# Patient Record
Sex: Female | Born: 1955 | Race: Black or African American | Hispanic: No | Marital: Single | Smoking: Never smoker
Health system: Southern US, Community
[De-identification: ages and names within clinical notes are randomized; demographics above are authoritative.]

## PROBLEM LIST (undated history)

## (undated) DIAGNOSIS — I499 Cardiac arrhythmia, unspecified: Secondary | ICD-10-CM

## (undated) DIAGNOSIS — I10 Essential (primary) hypertension: Secondary | ICD-10-CM

## (undated) DIAGNOSIS — M1990 Unspecified osteoarthritis, unspecified site: Secondary | ICD-10-CM

## (undated) DIAGNOSIS — I517 Cardiomegaly: Secondary | ICD-10-CM

## (undated) HISTORY — DX: Essential (primary) hypertension: I10

---

## 2005-01-21 ENCOUNTER — Ambulatory Visit: Payer: Self-pay | Admitting: Family Medicine

## 2005-01-26 ENCOUNTER — Ambulatory Visit: Payer: Self-pay | Admitting: Family Medicine

## 2007-09-20 ENCOUNTER — Emergency Department: Payer: Self-pay | Admitting: Emergency Medicine

## 2007-10-29 ENCOUNTER — Emergency Department: Payer: Self-pay | Admitting: Emergency Medicine

## 2007-12-01 HISTORY — PX: KNEE SURGERY: SHX244

## 2008-03-15 ENCOUNTER — Emergency Department: Payer: Self-pay | Admitting: Internal Medicine

## 2008-05-08 ENCOUNTER — Emergency Department: Payer: Self-pay | Admitting: Emergency Medicine

## 2008-09-12 ENCOUNTER — Emergency Department: Payer: Self-pay | Admitting: Emergency Medicine

## 2008-11-24 ENCOUNTER — Emergency Department: Payer: Self-pay | Admitting: Emergency Medicine

## 2008-12-14 ENCOUNTER — Ambulatory Visit: Payer: Self-pay | Admitting: Unknown Physician Specialty

## 2008-12-18 ENCOUNTER — Ambulatory Visit: Payer: Self-pay | Admitting: Unknown Physician Specialty

## 2010-02-10 ENCOUNTER — Ambulatory Visit: Payer: Self-pay

## 2011-02-10 ENCOUNTER — Emergency Department: Payer: Self-pay | Admitting: Emergency Medicine

## 2011-10-29 ENCOUNTER — Ambulatory Visit: Payer: Self-pay

## 2013-05-08 ENCOUNTER — Ambulatory Visit: Payer: Self-pay

## 2013-06-27 ENCOUNTER — Ambulatory Visit: Payer: Self-pay | Admitting: Pain Medicine

## 2014-02-05 ENCOUNTER — Emergency Department: Payer: Self-pay | Admitting: Emergency Medicine

## 2014-03-17 ENCOUNTER — Emergency Department: Payer: Self-pay | Admitting: Emergency Medicine

## 2014-08-24 ENCOUNTER — Ambulatory Visit: Payer: Self-pay | Admitting: Family Medicine

## 2015-01-15 ENCOUNTER — Encounter: Payer: Self-pay | Admitting: Physical Medicine & Rehabilitation

## 2015-01-22 ENCOUNTER — Ambulatory Visit (HOSPITAL_COMMUNITY)
Admission: RE | Admit: 2015-01-22 | Discharge: 2015-01-22 | Disposition: A | Discharge status: Home / Self Care | Payer: Medicaid Other | Source: Ambulatory Visit | Attending: Physical Medicine & Rehabilitation | Admitting: Physical Medicine & Rehabilitation

## 2015-01-22 ENCOUNTER — Ambulatory Visit (HOSPITAL_BASED_OUTPATIENT_CLINIC_OR_DEPARTMENT_OTHER): Payer: Medicaid Other | Admitting: Physical Medicine & Rehabilitation

## 2015-01-22 ENCOUNTER — Encounter: Payer: Medicaid Other | Attending: Physical Medicine & Rehabilitation

## 2015-01-22 ENCOUNTER — Other Ambulatory Visit: Payer: Self-pay | Admitting: Physical Medicine & Rehabilitation

## 2015-01-22 ENCOUNTER — Encounter: Payer: Self-pay | Admitting: Physical Medicine & Rehabilitation

## 2015-01-22 VITALS — BP 154/90 | HR 94 | Resp 14

## 2015-01-22 DIAGNOSIS — G894 Chronic pain syndrome: Secondary | ICD-10-CM

## 2015-01-22 DIAGNOSIS — M5136 Other intervertebral disc degeneration, lumbar region: Secondary | ICD-10-CM | POA: Insufficient documentation

## 2015-01-22 DIAGNOSIS — Z79899 Other long term (current) drug therapy: Secondary | ICD-10-CM | POA: Diagnosis not present

## 2015-01-22 DIAGNOSIS — Z5181 Encounter for therapeutic drug level monitoring: Secondary | ICD-10-CM | POA: Diagnosis present

## 2015-01-22 DIAGNOSIS — M5410 Radiculopathy, site unspecified: Secondary | ICD-10-CM

## 2015-01-22 DIAGNOSIS — M25561 Pain in right knee: Secondary | ICD-10-CM | POA: Diagnosis not present

## 2015-01-22 DIAGNOSIS — G8929 Other chronic pain: Secondary | ICD-10-CM | POA: Insufficient documentation

## 2015-01-22 DIAGNOSIS — M5416 Radiculopathy, lumbar region: Secondary | ICD-10-CM

## 2015-01-22 DIAGNOSIS — M25569 Pain in unspecified knee: Secondary | ICD-10-CM

## 2015-01-22 DIAGNOSIS — M545 Low back pain: Secondary | ICD-10-CM | POA: Diagnosis present

## 2015-01-22 MED ORDER — GABAPENTIN 300 MG PO CAPS: 300.0000 mg | ORAL_CAPSULE | Freq: Every day | ORAL | Status: DC
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
## 2015-01-22 NOTE — Patient Instructions (Addendum)
Recommending increase meloxicam to 2 tablets per day  Start a new medicine gabapentin to take before you go to bed  We will need to review urine drug screen as well as x-rays prior to prescribing a stronger pill such as oxycodone 5 milligrams

## 2015-01-23 LAB — PMP ALCOHOL METABOLITE (ETG): Ethyl Glucuronide (EtG): NEGATIVE ng/mL

## 2015-01-24 LAB — PRESCRIPTION MONITORING PROFILE (SOLSTAS)
Amphetamine/Meth: NEGATIVE ng/mL
Barbiturate Screen, Urine: NEGATIVE ng/mL
Benzodiazepine Screen, Urine: NEGATIVE ng/mL
Buprenorphine, Urine: NEGATIVE ng/mL
CANNABINOID SCRN UR: NEGATIVE ng/mL
COCAINE METABOLITES: NEGATIVE ng/mL
CREATININE, URINE: 148.41 mg/dL (ref >20.0)
Carisoprodol, Urine: NEGATIVE ng/mL
Fentanyl, Ur: NEGATIVE ng/mL
MDMA URINE: NEGATIVE ng/mL
MEPERIDINE UR: NEGATIVE ng/mL
METHADONE SCREEN, URINE: NEGATIVE ng/mL
Nitrites, Initial: NEGATIVE ug/mL
OXYCODONE SCRN UR: NEGATIVE ng/mL
Opiate Screen, Urine: NEGATIVE ng/mL
Propoxyphene: NEGATIVE ng/mL
Tapentadol, urine: NEGATIVE ng/mL
Tramadol Scrn, Ur: NEGATIVE ng/mL
Zolpidem, Urine: NEGATIVE ng/mL
pH, Initial: 6.8 pH (ref 4.5–8.9)

## 2015-02-01 NOTE — Progress Notes (Signed)
Urine drug screen for this encounter is consistent for no prescribed controlled medication

## 2015-02-12 ENCOUNTER — Encounter: Payer: Medicaid Other | Attending: Physical Medicine & Rehabilitation | Admitting: Registered Nurse

## 2015-02-12 ENCOUNTER — Encounter: Payer: Self-pay | Admitting: Registered Nurse

## 2015-02-12 VITALS — BP 142/76 | HR 93 | Resp 14

## 2015-02-12 DIAGNOSIS — Z5181 Encounter for therapeutic drug level monitoring: Secondary | ICD-10-CM | POA: Insufficient documentation

## 2015-02-12 DIAGNOSIS — M5410 Radiculopathy, site unspecified: Secondary | ICD-10-CM | POA: Diagnosis not present

## 2015-02-12 DIAGNOSIS — G894 Chronic pain syndrome: Secondary | ICD-10-CM | POA: Insufficient documentation

## 2015-02-12 DIAGNOSIS — M25561 Pain in right knee: Secondary | ICD-10-CM | POA: Diagnosis not present

## 2015-02-12 DIAGNOSIS — Z79899 Other long term (current) drug therapy: Secondary | ICD-10-CM | POA: Diagnosis not present

## 2015-02-12 DIAGNOSIS — G8929 Other chronic pain: Secondary | ICD-10-CM | POA: Insufficient documentation

## 2015-02-12 DIAGNOSIS — M5416 Radiculopathy, lumbar region: Secondary | ICD-10-CM

## 2015-02-12 MED ORDER — OXYCODONE HCL 5 MG PO TABS: 5.0000 mg | ORAL_TABLET | Freq: Three times a day (TID) | ORAL | Status: DC

## 2015-02-12 NOTE — Progress Notes (Signed)
Subjective:    Patient ID: Terri Jennings, female    DOB: 05-19-1956, 59 y.o.   MRN: [id number]  HPI: Ms. Terri Jennings is a 59 year old female who returns for follow up for chronic pain and medication refill. She says her pain is located in her lower back and radiates into her right lower extremity anteriorly and posteriorly. Also right knee pain.  She reiterates she is not interested in a knee replacement at this time.She rates her pain 10. Her current exercise regime is walking short distances. She had a UDS last vist her drug screen was consistent. She will be prescribed oxycodone per Dr. Wynn BankerKirsteins note.  Pain Inventory Average Pain 10 Pain Right Now 10 My pain is constant, stabbing, tingling and aching  In the last 24 hours, has pain interfered with the following? General activity 9 Relation with others 8 Enjoyment of life 9 What TIME of day is your pain at its worst? night Sleep (in general) Poor  Pain is worse with: walking, bending, sitting, inactivity and standing Pain improves with: medication Relief from Meds: 5  Mobility walk with assistance use a cane ability to climb steps?  yes do you drive?  no  Function disabled: date disabled . Do you have any goals in this area?  no  Neuro/Psych numbness tingling trouble walking spasms dizziness anxiety  Prior Studies Any changes since last visit?  no  Physicians involved in your care Any changes since last visit?  no   Family History  Problem Relation Age of Onset  . Stroke Father   . Cancer Maternal Grandmother    History   Social History  . Marital Status: Single    Spouse Name: N/A  . Number of Children: N/A  . Years of Education: N/A   Social History Main Topics  . Smoking status: Never Smoker   . Smokeless tobacco: Not on file  . Alcohol Use: 0.0 oz/week    0 Standard drinks or equivalent per week  . Drug Use: No  . Sexual Activity:    Partners: Male   Other Topics  Concern  . None   Social History Narrative   Past Surgical History  Procedure Laterality Date  . Knee surgery Right 2009   Past Medical History  Diagnosis Date  . Diabetes mellitus without complication   . Hypertension    BP 142/76 mmHg  Pulse 93  Resp 14  SpO2 96%  LMP  (Approximate)  Opioid Risk Score:   Fall Risk Score:    Review of Systems  Constitutional:       Night sweats  Cardiovascular: Positive for leg swelling.  Musculoskeletal: Positive for gait problem.  Neurological: Positive for dizziness and numbness.       Tingling spasms  Psychiatric/Behavioral: The patient is nervous/anxious.   All other systems reviewed and are negative.      Objective:   Physical Exam  Constitutional: She is oriented to person, place, and time. She appears well-developed and well-nourished.  HENT:  Head: Normocephalic and atraumatic.  Neck: Normal range of motion. Neck supple.  Cardiovascular: Normal rate and regular rhythm.   Pulmonary/Chest: Effort normal and breath sounds normal.  Musculoskeletal:  Normal Muscle Bulk and Muscle Testing Reveals: Upper Extremities: Full ROM and Muscle strength 5/5 Thoracic Paraspinal Tenderness: T-10- T-12 Lumbar Paraspinal Tenderness: L-3- L-5 Lower Extremities: Full ROM and Muscle Strength 5/5 Right Lower Extremity Flexion Produces Pain into Patella and Lumbar. Right Lower extremity Edema Patella Tender with Palpation/  Swelling Left Lower Extremity Flexion Produces Pain into Patella and Lumbar Arises from chair with ease Antalgic Gait     Neurological: She is alert and oriented to person, place, and time.  Skin: Skin is warm and dry.  Psychiatric: She has a normal mood and affect.  Nursing note and vitals reviewed.         Assessment & Plan:  1. Chronic Low Back Pain/ Radicular:RX: Oxycodone  3 times a day #90. Continue Gabapentin 2. Right Knee Pain: Continue Meloxicam  20 minutes of face to face patient care time was  spent during this visit. All questions were encouraged and answered.  F/U in 1 month

## 2015-03-12 ENCOUNTER — Encounter: Payer: Medicaid Other | Attending: Physical Medicine & Rehabilitation | Admitting: Registered Nurse

## 2015-03-12 DIAGNOSIS — M5410 Radiculopathy, site unspecified: Secondary | ICD-10-CM | POA: Insufficient documentation

## 2015-03-12 DIAGNOSIS — Z5181 Encounter for therapeutic drug level monitoring: Secondary | ICD-10-CM | POA: Insufficient documentation

## 2015-03-12 DIAGNOSIS — G8929 Other chronic pain: Secondary | ICD-10-CM | POA: Insufficient documentation

## 2015-03-12 DIAGNOSIS — G894 Chronic pain syndrome: Secondary | ICD-10-CM | POA: Insufficient documentation

## 2015-03-12 DIAGNOSIS — M25561 Pain in right knee: Secondary | ICD-10-CM | POA: Insufficient documentation

## 2015-03-12 DIAGNOSIS — Z79899 Other long term (current) drug therapy: Secondary | ICD-10-CM | POA: Insufficient documentation

## 2016-09-17 ENCOUNTER — Encounter: Payer: Medicaid Other | Attending: Physical Medicine & Rehabilitation

## 2016-09-17 ENCOUNTER — Ambulatory Visit (HOSPITAL_BASED_OUTPATIENT_CLINIC_OR_DEPARTMENT_OTHER): Payer: Medicaid Other | Admitting: Physical Medicine & Rehabilitation

## 2016-09-17 ENCOUNTER — Telehealth: Payer: Self-pay | Admitting: *Deleted

## 2016-09-17 ENCOUNTER — Encounter: Payer: Self-pay | Admitting: Physical Medicine & Rehabilitation

## 2016-09-17 VITALS — BP 122/84 | HR 89

## 2016-09-17 DIAGNOSIS — Z823 Family history of stroke: Secondary | ICD-10-CM | POA: Diagnosis not present

## 2016-09-17 DIAGNOSIS — M1711 Unilateral primary osteoarthritis, right knee: Secondary | ICD-10-CM

## 2016-09-17 DIAGNOSIS — M545 Low back pain: Secondary | ICD-10-CM | POA: Diagnosis not present

## 2016-09-17 DIAGNOSIS — G894 Chronic pain syndrome: Secondary | ICD-10-CM | POA: Insufficient documentation

## 2016-09-17 DIAGNOSIS — Z79891 Long term (current) use of opiate analgesic: Secondary | ICD-10-CM | POA: Diagnosis not present

## 2016-09-17 DIAGNOSIS — G8929 Other chronic pain: Secondary | ICD-10-CM | POA: Diagnosis not present

## 2016-09-17 DIAGNOSIS — E119 Type 2 diabetes mellitus without complications: Secondary | ICD-10-CM | POA: Diagnosis not present

## 2016-09-17 DIAGNOSIS — I10 Essential (primary) hypertension: Secondary | ICD-10-CM | POA: Insufficient documentation

## 2016-09-17 DIAGNOSIS — Z809 Family history of malignant neoplasm, unspecified: Secondary | ICD-10-CM | POA: Insufficient documentation

## 2016-09-17 NOTE — Telephone Encounter (Signed)
Called to let us know that she read the information on the butrans patch and it says not to take if you have mental health problems and she is diagnosed as a schizophrenic so she wanted us to know hat.  I told her I would let Dr Wynn BankerKirsteins know she called.

## 2016-09-17 NOTE — Patient Instructions (Signed)
If urine screen looks okay. Would call in a Butrans-patch. This is a past that you wear You do not have an allergy to this medication

## 2016-09-17 NOTE — Telephone Encounter (Signed)
Patient is not on any psychiatric medications that would be contraindicated with buprenorphine.   There are similar warnings with oxycodone and in fact oxycodone has more potential side effects in patients who have psychiatric illness.
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
## 2016-09-18 NOTE — Telephone Encounter (Signed)
I explained that too her that that would probably be the case.

## 2016-09-22 ENCOUNTER — Telehealth: Payer: Self-pay | Admitting: *Deleted

## 2016-09-22 NOTE — Telephone Encounter (Signed)
Pt called asking about her UDS result. I called her back and told her the results were not in yet and to please call back again in a couple of days

## 2016-09-23 ENCOUNTER — Telehealth: Payer: Self-pay | Admitting: *Deleted

## 2016-09-23 NOTE — Telephone Encounter (Signed)
Patient called again, asking about scheduling appointment for injections and if her urine screen has come back. (message previously left on pt's voice message explaining that UDS results had not come back yet)

## 2016-09-23 NOTE — Telephone Encounter (Signed)
I spoke again with Ms Terri Jennings and she would like to move her injection up as soon as possible (shceduled for 12/18/16). UDS is not back and we discussed again her concerns about wearing the patch planned for pain related more so to her hot flashes and night sweats.  She is concerned about the patch adhesive not working with diaphoresis.

## 2016-09-24 LAB — TOXASSURE SELECT,+ANTIDEPR,UR

## 2016-09-24 NOTE — Progress Notes (Signed)
Urine drug screen for this encounter is consistent for prescribed medication 

## 2016-09-24 NOTE — Telephone Encounter (Signed)
Butrans patch apply Qwk, #4, 0 RF

## 2016-09-24 NOTE — Telephone Encounter (Signed)
UDS was ok.  You mentioned the butrans in your office note but did not specify dose. Please do so.

## 2016-09-24 NOTE — Telephone Encounter (Signed)
The buprenorphine is not available in a pill form. Please try the patch, may try antiperspirant under the patch if needed

## 2016-09-25 ENCOUNTER — Telehealth: Payer: Self-pay

## 2016-09-25 DIAGNOSIS — G8929 Other chronic pain: Secondary | ICD-10-CM

## 2016-09-25 DIAGNOSIS — M5410 Radiculopathy, site unspecified: Secondary | ICD-10-CM

## 2016-09-25 DIAGNOSIS — M25561 Pain in right knee: Secondary | ICD-10-CM

## 2016-09-25 MED ORDER — BUPRENORPHINE 5 MCG/HR TD PTWK: 5.0000 ug | MEDICATED_PATCH | TRANSDERMAL | 0 refills | Status: DC

## 2016-09-25 NOTE — Telephone Encounter (Signed)
Patient has called four times sating that her Butrans patches are over 200 dollars, Patient stated she can not afford to pay that amount for patches because she is paying out of pocket. Would like pills if she could because they are a lot cheaper to advise. Please Advise

## 2016-09-25 NOTE — Telephone Encounter (Signed)
Patient states that she had Medicaid. Please confirm whether this is the case. Alternative medication would be Nucynta 50 mg 3 times per day, #90, no refill

## 2016-09-25 NOTE — Telephone Encounter (Signed)
Patient has been notified of Butrans patch 5 mcg was told to apply Qwk, #4, 0 Rf. Per Dr Wynn BankerKirsteins.  Also notified patient of UDS being consistent. [facility] [place]

## 2016-09-28 ENCOUNTER — Other Ambulatory Visit: Payer: Self-pay

## 2016-09-28 NOTE — Telephone Encounter (Signed)
Rx for Hydrocodone 5 mg 3 times a day #90, no refill

## 2016-09-28 NOTE — Telephone Encounter (Signed)
She is a self pay

## 2016-09-29 MED ORDER — HYDROCODONE-ACETAMINOPHEN 5-325 MG PO TABS: 1.0000 | ORAL_TABLET | Freq: Three times a day (TID) | ORAL | 0 refills | Status: DC | PRN

## 2016-09-29 NOTE — Addendum Note (Signed)
Addended by: Doreene ElandSHUMAKER, Beauregard Jarrells W on: 09/29/2016 01:52 PM   Modules accepted: Orders

## 2016-09-29 NOTE — Telephone Encounter (Signed)
Terri Jennings called again for pain med.  I have told her we are writing an RX for hydrocodone and she will need to come to pick up at office..  A CSA will be signed.

## 2016-10-05 ENCOUNTER — Telehealth: Payer: Self-pay | Admitting: Physical Medicine & Rehabilitation

## 2016-10-05 NOTE — Telephone Encounter (Signed)
Terri Jennings is saying these medication is making her sick (hydrocodone)  She has listed oxycontin and morphine makes her sick. If she wants something else she is going to have to come back in to be seen.  She says she threw the hydrocodone away and I told he she cannot do that.  She says they are just in the trash so she is going to go through it and get them out and bring them to appt.  She is making an appointment.

## 2016-10-05 NOTE — Telephone Encounter (Signed)
Patient has questions about medication she picked up in office.  Please call her at [phone].

## 2016-10-06 ENCOUNTER — Encounter: Payer: Self-pay | Admitting: Physical Medicine & Rehabilitation

## 2016-10-06 ENCOUNTER — Ambulatory Visit (HOSPITAL_BASED_OUTPATIENT_CLINIC_OR_DEPARTMENT_OTHER): Payer: Medicaid Other | Admitting: Physical Medicine & Rehabilitation

## 2016-10-06 ENCOUNTER — Encounter: Payer: Medicaid Other | Attending: Physical Medicine & Rehabilitation

## 2016-10-06 VITALS — BP 151/96 | HR 82 | Resp 16

## 2016-10-06 DIAGNOSIS — E119 Type 2 diabetes mellitus without complications: Secondary | ICD-10-CM | POA: Insufficient documentation

## 2016-10-06 DIAGNOSIS — G894 Chronic pain syndrome: Secondary | ICD-10-CM | POA: Insufficient documentation

## 2016-10-06 DIAGNOSIS — G8929 Other chronic pain: Secondary | ICD-10-CM | POA: Diagnosis not present

## 2016-10-06 DIAGNOSIS — Z809 Family history of malignant neoplasm, unspecified: Secondary | ICD-10-CM | POA: Insufficient documentation

## 2016-10-06 DIAGNOSIS — Z79891 Long term (current) use of opiate analgesic: Secondary | ICD-10-CM | POA: Insufficient documentation

## 2016-10-06 DIAGNOSIS — M25561 Pain in right knee: Secondary | ICD-10-CM

## 2016-10-06 DIAGNOSIS — Z823 Family history of stroke: Secondary | ICD-10-CM | POA: Insufficient documentation

## 2016-10-06 DIAGNOSIS — I10 Essential (primary) hypertension: Secondary | ICD-10-CM | POA: Diagnosis not present

## 2016-10-06 DIAGNOSIS — M545 Low back pain, unspecified: Secondary | ICD-10-CM

## 2016-10-06 DIAGNOSIS — M1711 Unilateral primary osteoarthritis, right knee: Secondary | ICD-10-CM | POA: Insufficient documentation

## 2016-10-06 MED ORDER — OXYCODONE HCL 5 MG PO TABS: 5.0000 mg | ORAL_TABLET | Freq: Three times a day (TID) | ORAL | 0 refills | Status: DC | PRN

## 2016-10-06 NOTE — Progress Notes (Signed)
Subjective:    Patient ID: Terri Jennings, female    DOB: [birth year], 58 y.o.   MRN: [id number]  HPI 60 year old female with knee osteoarthritis graded as moderate as well as chronic low back pain with minimal x-ray findings. She returns today stating that she could not tolerate the hydrocodone 5 mg 3 times a day prescribed. Pt states that she was able to tolerate oxycodone 5mg  TID in the past Patient indicates that her right knee feels swollen. No falls or other new trauma. She states she's had cortisone injections in the knee, which did not last very long. The Synvisc lasted a bit longer, but this was only 6-8 weeks.  She has seen orthopedics in the past and discussed knee replacement with her. Germs of her low back. She keys have pain. She states one doctor told her, her back was 2 inches shorter on one side. Prior lumbar x-rays were reviewed from 2016 which showed no significant disc space narrowing in the lower lumbar. There was some narrowing at L1-L2, no facet arthropathy, good alignment. No evidence of scoliosis Pain Inventory Average Pain 9 Pain Right Now 9 My pain is sharp, tingling and aching  In the last 24 hours, has pain interfered with the following? General activity 0 Relation with others 0 Enjoyment of life 0 What TIME of day is your pain at its worst? daytime evening and night Sleep (in general) Fair  Pain is worse with: walking, bending, sitting and standing Pain improves with: medication Relief from Meds: not answered  Mobility how many minutes can you walk? 5 ability to climb steps?  no do you drive?  yes  Function disabled: date disabled .  Neuro/Psych tremor anxiety  Prior Studies Any changes since last visit?  no  Physicians involved in your care Any changes since last visit?  no   Family History  Problem Relation Age of Onset  . Stroke Father   . Cancer Maternal Grandmother    Social History   Social History  . Marital  status: Single    Spouse name: N/A  . Number of children: N/A  . Years of education: N/A   Social History Main Topics  . Smoking status: Never Smoker  . Smokeless tobacco: Never Used  . Alcohol use 0.0 oz/week  . Drug use: No  . Sexual activity: Not Currently    Partners: Male   Other Topics Concern  . None   Social History Narrative  . None   Past Surgical History:  Procedure Laterality Date  . KNEE SURGERY Right 2009   Past Medical History:  Diagnosis Date  . Diabetes mellitus without complication (HCC)   . Hypertension    BP (!) 151/96   Pulse 82   Resp 16   LMP  (Approximate) Comment: 06/2014  SpO2 97%   Opioid Risk Score:   Fall Risk Score:  `1  Depression screen PHQ 2/9  [facility] 09/17/2016 02/12/2015  Decreased Interest 2 0  Down, Depressed, Hopeless 1 1  PHQ - 2 Score 3 1  Altered sleeping 2 2  Tired, decreased energy 2 3  Change in appetite 2 3  Feeling bad or failure about yourself  1 0  Trouble concentrating 1 0  Moving slowly or fidgety/restless 1 0  Suicidal thoughts 0 0  PHQ-9 Score 12 9  Difficult doing work/chores Somewhat difficult -    Review of Systems  Constitutional: Positive for unexpected weight change.  All other systems reviewed and  are negative.      Objective:   Physical Exam  Constitutional: She is oriented to person, place, and time. She appears well-developed and well-nourished.  HENT:  Head: Normocephalic and atraumatic.  Eyes: Conjunctivae and EOM are normal. Pupils are equal, round, and reactive to light.  Neck: Normal range of motion.  Neurological: She is alert and oriented to person, place, and time.  Psychiatric: She has a normal mood and affect.  Nursing note and vitals reviewed.  right knee has no evidence of erythema. No tenderness to palpation. Minimal effusion, patella not ballotable, good knee range of motion. Left knee without tenderness, erythema, swelling, effusion. Straight leg  raising. Motor strength is normal in lower extremities.      Assessment & Plan:  1. Chronic knee pain, right greater than left, evidence of at least moderate osteoarthritis, patellofemoral, as well as femuro tibial Given her prior experience with intra-articular injections would not recommend repeat. Have offered referral to orthopedics. However, she does not wish to consider surgery at the current time. She may benefit from Voltaren gel, although her insurance may not pay. Will trial oxycodone 5 mg 3 times a day follow-up with respect to her one month. She turned in her hydrocodone. Had tried for 2 days with severe nausea and dizziness 2. Chronic low back pain. Last x-rays unremarkable. No radicular symptoms. We'll repeat x-rays. Given her prolonged pain last films done approximately 2 years ago

## 2016-10-06 NOTE — Patient Instructions (Signed)
Do not feel any knee injections will give long-term relief, may benefit from orthopedic referral  X-rays of the low back and mid back.

## 2016-10-08 NOTE — Addendum Note (Signed)
Addended by: Doreene ElandSHUMAKER, SYBIL W on: 10/08/2016 02:53 PM   Modules accepted: Orders

## 2016-11-03 ENCOUNTER — Ambulatory Visit
Admission: RE | Admit: 2016-11-03 | Discharge: 2016-11-03 | Disposition: A | Discharge status: Home / Self Care | Payer: Medicaid Other | Source: Ambulatory Visit | Attending: Physical Medicine & Rehabilitation | Admitting: Physical Medicine & Rehabilitation

## 2016-11-03 ENCOUNTER — Telehealth: Payer: Self-pay | Admitting: *Deleted

## 2016-11-03 DIAGNOSIS — M4184 Other forms of scoliosis, thoracic region: Secondary | ICD-10-CM | POA: Insufficient documentation

## 2016-11-03 DIAGNOSIS — G8929 Other chronic pain: Secondary | ICD-10-CM | POA: Insufficient documentation

## 2016-11-03 DIAGNOSIS — M545 Low back pain, unspecified: Secondary | ICD-10-CM

## 2016-11-03 DIAGNOSIS — M8588 Other specified disorders of bone density and structure, other site: Secondary | ICD-10-CM | POA: Diagnosis not present

## 2016-11-03 DIAGNOSIS — M47896 Other spondylosis, lumbar region: Secondary | ICD-10-CM | POA: Insufficient documentation

## 2016-11-03 NOTE — Telephone Encounter (Signed)
Radiology called and says thoracolumbar spine order is not an acceptable order.  New orders placed

## 2016-11-04 ENCOUNTER — Telehealth: Payer: Self-pay

## 2016-11-04 NOTE — Telephone Encounter (Addendum)
Patient called for a med refill on oxycodone meds,

## 2016-11-04 NOTE — Telephone Encounter (Signed)
Attempted to reach Ms Ilda BassetCardona. No answer and voicemail could not accept messages.  Letter has been written for discharge and a copy of surrounding pain clinics will be included

## 2016-11-04 NOTE — Telephone Encounter (Signed)
discharge due to mult providers

## 2016-11-04 NOTE — Telephone Encounter (Signed)
Terri Jennings called for a refill of oxycodone that she filled on 10/06/16. I pulled a NCCSR on her and she filled on 10/19/16 and #90 oxycodone 10/325 from her [place] on 10/19/16.  This was denied by Ms Ilda Bassetardona but I verified it with the pharmacy which [facility]

## 2016-12-18 ENCOUNTER — Ambulatory Visit: Payer: Self-pay | Admitting: Physical Medicine & Rehabilitation

## 2017-03-11 ENCOUNTER — Encounter: Payer: Self-pay | Admitting: *Deleted

## 2017-03-18 ENCOUNTER — Encounter
Admission: RE | Disposition: A | Discharge status: Home / Self Care | Payer: Self-pay | Source: Ambulatory Visit | Attending: Ophthalmology

## 2017-03-18 ENCOUNTER — Ambulatory Visit: Payer: Medicaid Other | Admitting: Anesthesiology

## 2017-03-18 ENCOUNTER — Ambulatory Visit
Admission: RE | Admit: 2017-03-18 | Discharge: 2017-03-18 | Disposition: A | Discharge status: Home / Self Care | Payer: Medicaid Other | Source: Ambulatory Visit | Attending: Ophthalmology | Admitting: Ophthalmology

## 2017-03-18 ENCOUNTER — Encounter: Payer: Self-pay | Admitting: *Deleted

## 2017-03-18 DIAGNOSIS — I509 Heart failure, unspecified: Secondary | ICD-10-CM | POA: Insufficient documentation

## 2017-03-18 DIAGNOSIS — I110 Hypertensive heart disease with heart failure: Secondary | ICD-10-CM | POA: Diagnosis not present

## 2017-03-18 DIAGNOSIS — H2511 Age-related nuclear cataract, right eye: Secondary | ICD-10-CM | POA: Insufficient documentation

## 2017-03-18 DIAGNOSIS — M1990 Unspecified osteoarthritis, unspecified site: Secondary | ICD-10-CM | POA: Diagnosis not present

## 2017-03-18 DIAGNOSIS — K219 Gastro-esophageal reflux disease without esophagitis: Secondary | ICD-10-CM | POA: Insufficient documentation

## 2017-03-18 HISTORY — DX: Cardiomegaly: I51.7

## 2017-03-18 HISTORY — DX: Unspecified osteoarthritis, unspecified site: M19.90

## 2017-03-18 HISTORY — DX: Cardiac arrhythmia, unspecified: I49.9

## 2017-03-18 HISTORY — PX: CATARACT EXTRACTION W/PHACO: SHX586

## 2017-03-18 SURGERY — PHACOEMULSIFICATION, CATARACT, WITH IOL INSERTION
Anesthesia: Monitor Anesthesia Care | Site: Eye | Laterality: Right | Wound class: Clean

## 2017-03-18 MED ORDER — TRYPAN BLUE 0.06 % OP SOLN
OPHTHALMIC | Status: AC
  Filled 2017-03-18: qty 0.5

## 2017-03-18 MED ORDER — MIDAZOLAM HCL 2 MG/2ML IJ SOLN
INTRAMUSCULAR | Status: AC
  Filled 2017-03-18: qty 2

## 2017-03-18 MED ORDER — ARMC OPHTHALMIC DILATING DROPS
OPHTHALMIC | Status: AC
  Administered 2017-03-18: 1 via OPHTHALMIC
  Filled 2017-03-18: qty 0.4

## 2017-03-18 MED ORDER — MOXIFLOXACIN HCL 0.5 % OP SOLN: 1.0000 [drp] | OPHTHALMIC | Status: DC | PRN

## 2017-03-18 MED ORDER — TRYPAN BLUE 0.06 % OP SOLN
OPHTHALMIC | Status: DC | PRN
  Administered 2017-03-18: 0.5 mL via INTRAOCULAR

## 2017-03-18 MED ORDER — MOXIFLOXACIN HCL 0.5 % OP SOLN
OPHTHALMIC | Status: DC | PRN
  Administered 2017-03-18: 0.2 mL via OPHTHALMIC

## 2017-03-18 MED ORDER — MIDAZOLAM HCL 2 MG/2ML IJ SOLN
INTRAMUSCULAR | Status: DC | PRN
  Administered 2017-03-18: 1 mg via INTRAVENOUS
  Administered 2017-03-18: .5 mg via INTRAVENOUS

## 2017-03-18 MED ORDER — SODIUM HYALURONATE 23 MG/ML IO SOLN
INTRAOCULAR | Status: DC | PRN
  Administered 2017-03-18: 0.6 mL via INTRAOCULAR

## 2017-03-18 MED ORDER — ARMC OPHTHALMIC DILATING DROPS
1.0000 "application " | OPHTHALMIC | Status: AC
  Administered 2017-03-18 (×3): 1 via OPHTHALMIC

## 2017-03-18 MED ORDER — MOXIFLOXACIN HCL 0.5 % OP SOLN
OPHTHALMIC | Status: AC
  Filled 2017-03-18: qty 3

## 2017-03-18 MED ORDER — FENTANYL CITRATE (PF) 100 MCG/2ML IJ SOLN
INTRAMUSCULAR | Status: DC | PRN
  Administered 2017-03-18: 25 ug via INTRAVENOUS
  Administered 2017-03-18: 50 ug via INTRAVENOUS

## 2017-03-18 MED ORDER — LIDOCAINE HCL (PF) 2 % IJ SOLN
INTRAMUSCULAR | Status: AC
  Filled 2017-03-18: qty 2

## 2017-03-18 MED ORDER — EPINEPHRINE PF 1 MG/ML IJ SOLN
INTRAMUSCULAR | Status: DC | PRN
  Administered 2017-03-18: 08:00:00 via OPHTHALMIC

## 2017-03-18 MED ORDER — EPINEPHRINE PF 1 MG/ML IJ SOLN
INTRAMUSCULAR | Status: AC
  Filled 2017-03-18: qty 2

## 2017-03-18 MED ORDER — LIDOCAINE HCL (PF) 4 % IJ SOLN
INTRAOCULAR | Status: DC | PRN
  Administered 2017-03-18: 4 mL via OPHTHALMIC

## 2017-03-18 MED ORDER — FENTANYL CITRATE (PF) 100 MCG/2ML IJ SOLN
INTRAMUSCULAR | Status: AC
  Filled 2017-03-18: qty 2

## 2017-03-18 MED ORDER — SODIUM HYALURONATE 23 MG/ML IO SOLN
INTRAOCULAR | Status: AC
  Filled 2017-03-18: qty 0.6

## 2017-03-18 MED ORDER — POVIDONE-IODINE 5 % OP SOLN
OPHTHALMIC | Status: AC
  Filled 2017-03-18: qty 30

## 2017-03-18 MED ORDER — SODIUM HYALURONATE 10 MG/ML IO SOLN
INTRAOCULAR | Status: DC | PRN
  Administered 2017-03-18: 0.85 mL via INTRAOCULAR

## 2017-03-18 MED ORDER — SODIUM CHLORIDE 0.9 % IV SOLN
INTRAVENOUS | Status: DC
Start: 2017-03-18 — End: 2017-03-18
  Administered 2017-03-18 (×2): via INTRAVENOUS

## 2017-03-18 MED ORDER — CARBACHOL 0.01 % IO SOLN
INTRAOCULAR | Status: DC | PRN
  Administered 2017-03-18: 0.5 mL via INTRAOCULAR

## 2017-03-18 MED ORDER — SODIUM HYALURONATE 10 MG/ML IO SOLN
INTRAOCULAR | Status: AC
  Filled 2017-03-18: qty 0.85

## 2017-03-18 SURGICAL SUPPLY — 22 items
CANNULA ANT/CHMB 27GA (MISCELLANEOUS) ×9 IMPLANT
CUP MEDICINE 2OZ PLAST GRAD ST (MISCELLANEOUS) ×3 IMPLANT
DISSECTOR HYDRO NUCLEUS 50X22 (MISCELLANEOUS) ×3 IMPLANT
FILTER MILLEX .045 (MISCELLANEOUS) ×1 IMPLANT
FLTR MILLEX .045 (MISCELLANEOUS) ×3
GLOVE BIO SURGEON STRL SZ8 (GLOVE) ×3 IMPLANT
GLOVE BIOGEL M 6.5 STRL (GLOVE) ×3 IMPLANT
GLOVE SURG LX 7.5 STRW (GLOVE) ×2
GLOVE SURG LX STRL 7.5 STRW (GLOVE) ×1 IMPLANT
GOWN STRL REUS W/ TWL LRG LVL3 (GOWN DISPOSABLE) ×2 IMPLANT
GOWN STRL REUS W/TWL LRG LVL3 (GOWN DISPOSABLE) ×4
LENS IOL TECNIS ITEC 22.5 (Intraocular Lens) ×3 IMPLANT
PACK CATARACT (MISCELLANEOUS) ×3 IMPLANT
PACK CATARACT KING (MISCELLANEOUS) ×3 IMPLANT
PACK EYE AFTER SURG (MISCELLANEOUS) ×3 IMPLANT
SOL BSS BAG (MISCELLANEOUS) ×3
SOLUTION BSS BAG (MISCELLANEOUS) ×1 IMPLANT
SYR 3ML LL SCALE MARK (SYRINGE) ×9 IMPLANT
SYR 5ML LL (SYRINGE) ×3 IMPLANT
SYR TB 1ML 27GX1/2 LL (SYRINGE) ×3 IMPLANT
WATER STERILE IRR 250ML POUR (IV SOLUTION) ×3 IMPLANT
WIPE NON LINTING 3.25X3.25 (MISCELLANEOUS) ×3 IMPLANT

## 2017-03-18 NOTE — OR Nursing (Signed)
IV removed from right hand. Site clear. No edema or redness.

## 2017-03-18 NOTE — Anesthesia Procedure Notes (Signed)
Procedure Name: MAC Date/Time: 03/18/2017 8:25 AM Performed by: Allean Found Pre-anesthesia Checklist: Patient identified, Emergency Drugs available, Suction available, Patient being monitored and Timeout performed Patient Re-evaluated:Patient Re-evaluated prior to inductionOxygen Delivery Method: Nasal cannula Placement Confirmation: positive ETCO2

## 2017-03-18 NOTE — Transfer of Care (Signed)
Immediate Anesthesia Transfer of Care Note  Patient: Terri Jennings  Procedure(s) Performed: Procedure(s) with comments: CATARACT EXTRACTION PHACO AND INTRAOCULAR LENS PLACEMENT (IOC) (Right) - [place] 1:00.5 AP% 12.5 CDE 7.78 FLUID PACK LOT # [id number]  Patient Location: PACU  Anesthesia Type:MAC  Level of Consciousness: awake, alert  and oriented  Airway & Oxygen Therapy: Patient Spontanous Breathing  Post-op Assessment: Report given to RN and Post -op Vital signs reviewed and stable  Post vital signs: Reviewed and stable  Last Vitals:  Vitals:   03/18/17 0645  BP: 116/71  Pulse: 86  Resp: 20  Temp: 36.7 C    Last Pain:  Vitals:   03/18/17 0645  TempSrc: Oral  PainSc: 5          Complications: No apparent anesthesia complications

## 2017-03-18 NOTE — Anesthesia Post-op Follow-up Note (Cosign Needed)
Anesthesia QCDR form completed.        

## 2017-03-18 NOTE — Anesthesia Preprocedure Evaluation (Signed)
Anesthesia Evaluation  Patient identified by MRN, date of birth, ID band Patient awake    Reviewed: Allergy & Precautions, H&P , NPO status , Patient's Chart, lab work & pertinent test results  History of Anesthesia Complications Negative for: history of anesthetic complications  Airway Mallampati: III  TM Distance: >3 FB Neck ROM: full    Dental  (+) Poor Dentition, Missing, Upper Dentures, Partial Lower   Pulmonary neg pulmonary ROS, neg shortness of breath,    Pulmonary exam normal breath sounds clear to auscultation       Cardiovascular Exercise Tolerance: Good hypertension, (-) angina+CHF  (-) Past MI and (-) DOE Normal cardiovascular exam+ dysrhythmias  Rhythm:regular Rate:Normal     Neuro/Psych  Neuromuscular disease negative psych ROS   GI/Hepatic negative GI ROS, Neg liver ROS, neg GERD  ,  Endo/Other  negative endocrine ROS  Renal/GU      Musculoskeletal  (+) Arthritis ,   Abdominal   Peds  Hematology negative hematology ROS (+)   Anesthesia Other Findings Past Medical History: No date: Arthritis No date: Dysrhythmia No date: Enlarged heart No date: Hypertension  Past Surgical History: 2009: KNEE SURGERY Right  BMI    Body Mass Index:  35.87 kg/m      Reproductive/Obstetrics negative OB ROS                             Anesthesia Physical Anesthesia Plan  ASA: III  Anesthesia Plan: MAC   Post-op Pain Management:    Induction:   Airway Management Planned:   Additional Equipment:   Intra-op Plan:   Post-operative Plan:   Informed Consent: I have reviewed the patients History and Physical, chart, labs and discussed the procedure including the risks, benefits and alternatives for the proposed anesthesia with the patient or authorized representative who has indicated his/her understanding and acceptance.     Plan Discussed with: Anesthesiologist, CRNA and  Surgeon  Anesthesia Plan Comments:         Anesthesia Quick Evaluation

## 2017-03-18 NOTE — Discharge Instructions (Signed)
Eye Surgery Discharge Instructions  Expect mild scratchy sensation or mild soreness. DO NOT RUB YOUR EYE!  The day of surgery:  Minimal physical activity, but bed rest is not required  No reading, computer work, or close hand work  No bending, lifting, or straining.  May watch TV  For 24 hours:  No driving, legal decisions, or alcoholic beverages  Safety precautions  Eat anything you prefer: It is better to start with liquids, then soup then solid foods.  _____ Eye patch should be worn until postoperative exam tomorrow.  ____ Solar shield eyeglasses should be worn for comfort in the sunlight/patch while sleeping  Resume all regular medications including aspirin or Coumadin if these were discontinued prior to surgery. You may shower, bathe, shave, or wash your hair. Tylenol may be taken for mild discomfort.  Call your doctor if you experience significant pain, nausea, or vomiting, fever > 101 or other signs of infection. [phone] or [phone] Specific instructions:  Follow-up Information    Willey Blade, MD Follow up.   Specialty:  Ophthalmology Why:  Terri Jennings at 9:45am Contact information: [address] [place] [address] [phone]

## 2017-03-18 NOTE — H&P (Signed)
The History and Physical notes are on paper, have been signed, and are to be scanned.   I have examined the patient and there are no changes to the H&P.   Willey Blade 03/18/2017 8:13 AM

## 2017-03-18 NOTE — Op Note (Signed)
OPERATIVE NOTE  Terri Jennings [id number] 03/18/2017   PREOPERATIVE DIAGNOSIS:  Nuclear sclerotic cataract right eye.  H25.11   POSTOPERATIVE DIAGNOSIS:    Nuclear sclerotic cataract right eye.     PROCEDURE:  Phacoemusification with posterior chamber intraocular lens placement of the right eye   LENS:   Implant Name Type Inv. Item Serial No. Manufacturer Lot No. LRB No. Used  LENS IOL DIOP 22.5 - [id number] Intraocular Lens LENS IOL DIOP 22.5 [id number] AMO   Right 1       PCB00 +22.5   ULTRASOUND TIME: 1 minutes 00 seconds.  CDE 7.78   SURGEON:  Willey Blade, MD, MPH  ANESTHESIOLOGIST: Anesthesiologist: Rosaria Ferries, MD CRNA: Henrietta Hoover, CRNA; Junious Silk, CRNA   ANESTHESIA:  Topical with tetracaine drops augmented with 1% preservative-free intracameral lidocaine.  ESTIMATED BLOOD LOSS: less than 1 mL.   COMPLICATIONS:  None.   DESCRIPTION OF PROCEDURE:  The patient was identified in the holding room and transported to the operating room and placed in the supine position under the operating microscope.  The right eye was identified as the operative eye and it was prepped and draped in the usual sterile ophthalmic fashion.   A 1.0 millimeter clear-corneal paracentesis was made at the  9:30 position. 0.5 ml of preservative-free 1% lidocaine with epinephrine was injected into the anterior chamber.  There was a poor red reflex, so Trypan blue was instilled under an air bubble and rinsed out with BSS to stain the capsule for improved visualization.   The anterior chamber was filled with Healon 5 viscoelastic.  A 2.4 millimeter keratome was used to make a near-clear corneal incision at the 8:00 position.  A curvilinear capsulorrhexis was made with a cystotome and capsulorrhexis forceps.  Balanced salt solution was used to hydrodissect and hydrodelineate the nucleus.   A second paracentesis was made at the 10:30 position for more optimal angle for chopping.   Phacoemulsification was then used in stop and chop fashion to remove the lens nucleus and epinucleus.  The remaining cortex was then removed using the irrigation and aspiration handpiece. Healon was then placed into the capsular bag to distend it for lens placement.  A lens was then injected into the capsular bag.  The remaining viscoelastic was aspirated.   Wounds were hydrated with balanced salt solution.  The anterior chamber was inflated to a physiologic pressure with balanced salt solution.   Intracameral vigamox 0.1 mL undiluted was injected into the eye and a drop placed onto the ocular surface.  No wound leaks were noted.  The patient was taken to the recovery room in stable condition without complications of anesthesia or surgery  Willey Blade 03/18/2017, 9:00 AM

## 2017-03-18 NOTE — Anesthesia Procedure Notes (Signed)
Performed by: Cortnie Ringel       

## 2017-03-18 NOTE — OR Nursing (Signed)
No IV documented. IV site to right hand 22 gauge. Site without redness or edema.

## 2017-03-18 NOTE — Anesthesia Postprocedure Evaluation (Signed)
Anesthesia Post Note  Patient: Terri Jennings  Procedure(s) Performed: Procedure(s) (LRB): CATARACT EXTRACTION PHACO AND INTRAOCULAR LENS PLACEMENT (IOC) (Right)  Patient location during evaluation: PACU Anesthesia Type: MAC Level of consciousness: awake, awake and alert and oriented Pain management: pain level controlled Vital Signs Assessment: post-procedure vital signs reviewed and stable Respiratory status: spontaneous breathing, nonlabored ventilation and respiratory function stable Cardiovascular status: stable Anesthetic complications: no     Last Vitals:  Vitals:   03/18/17 0645 03/18/17 0903  BP: 116/71 123/82  Pulse: 86 72  Resp: 20 18  Temp: 36.7 C 36.6 C    Last Pain:  Vitals:   03/18/17 0645  TempSrc: Oral  PainSc: 5                  Raychell Holcomb,  Mckinzy Fuller R

## 2017-03-19 ENCOUNTER — Encounter: Payer: Self-pay | Admitting: Ophthalmology

## 2017-12-28 ENCOUNTER — Ambulatory Visit
Payer: Medicaid Other | Attending: Student in an Organized Health Care Education/Training Program | Admitting: Student in an Organized Health Care Education/Training Program

## 2017-12-28 ENCOUNTER — Encounter: Payer: Self-pay | Admitting: Student in an Organized Health Care Education/Training Program

## 2017-12-28 ENCOUNTER — Other Ambulatory Visit: Payer: Self-pay

## 2017-12-28 VITALS — BP 124/74 | HR 92 | Temp 98.0°F | Resp 16 | Ht 70.0 in | Wt 282.0 lb

## 2017-12-28 DIAGNOSIS — G8929 Other chronic pain: Secondary | ICD-10-CM

## 2017-12-28 DIAGNOSIS — M545 Low back pain: Secondary | ICD-10-CM | POA: Diagnosis present

## 2017-12-28 DIAGNOSIS — Z79899 Other long term (current) drug therapy: Secondary | ICD-10-CM | POA: Insufficient documentation

## 2017-12-28 DIAGNOSIS — Z9889 Other specified postprocedural states: Secondary | ICD-10-CM | POA: Diagnosis not present

## 2017-12-28 DIAGNOSIS — M47816 Spondylosis without myelopathy or radiculopathy, lumbar region: Secondary | ICD-10-CM | POA: Diagnosis not present

## 2017-12-28 DIAGNOSIS — M25561 Pain in right knee: Secondary | ICD-10-CM

## 2017-12-28 DIAGNOSIS — M25532 Pain in left wrist: Secondary | ICD-10-CM | POA: Insufficient documentation

## 2017-12-28 DIAGNOSIS — M25531 Pain in right wrist: Secondary | ICD-10-CM | POA: Insufficient documentation

## 2017-12-28 DIAGNOSIS — G894 Chronic pain syndrome: Secondary | ICD-10-CM | POA: Diagnosis not present

## 2017-12-28 DIAGNOSIS — Z6841 Body Mass Index (BMI) 40.0 and over, adult: Secondary | ICD-10-CM | POA: Insufficient documentation

## 2017-12-28 DIAGNOSIS — M5136 Other intervertebral disc degeneration, lumbar region: Secondary | ICD-10-CM

## 2017-12-28 DIAGNOSIS — M1711 Unilateral primary osteoarthritis, right knee: Secondary | ICD-10-CM | POA: Diagnosis not present

## 2017-12-28 MED ORDER — TIZANIDINE HCL 4 MG PO TABS: 4.0000 mg | ORAL_TABLET | Freq: Two times a day (BID) | ORAL | 1 refills | Status: DC | PRN

## 2017-12-28 MED ORDER — GABAPENTIN 300 MG PO CAPS: 300.0000 mg | ORAL_CAPSULE | Freq: Two times a day (BID) | ORAL | 1 refills | Status: DC
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
## 2017-12-28 NOTE — Patient Instructions (Signed)
1.  UDS today 2.  Referral to pain psychology 3.  Scheduled for right knee genicular nerve block with sedation 4.  Increase gabapentin to 300 mg twice daily.  Prescription provided 5.  Zanaflex 4 mg twice daily as needed muscle spasms

## 2017-12-28 NOTE — Progress Notes (Signed)
Safety precautions to be maintained throughout the outpatient stay will include: orient to surroundings, keep bed in low position, maintain call bell within reach at all times, provide assistance with transfer out of bed and ambulation.  

## 2018-01-02 LAB — COMPLIANCE DRUG ANALYSIS, UR

## 2018-01-19 ENCOUNTER — Ambulatory Visit: Payer: Medicaid Other | Admitting: Student in an Organized Health Care Education/Training Program

## 2018-01-24 ENCOUNTER — Encounter: Payer: Self-pay | Admitting: Student in an Organized Health Care Education/Training Program

## 2018-01-24 ENCOUNTER — Ambulatory Visit (HOSPITAL_BASED_OUTPATIENT_CLINIC_OR_DEPARTMENT_OTHER): Payer: Medicaid Other | Admitting: Student in an Organized Health Care Education/Training Program

## 2018-01-24 ENCOUNTER — Ambulatory Visit
Admission: RE | Admit: 2018-01-24 | Discharge: 2018-01-24 | Disposition: A | Discharge status: Home / Self Care | Payer: Medicaid Other | Source: Ambulatory Visit | Attending: Student in an Organized Health Care Education/Training Program | Admitting: Student in an Organized Health Care Education/Training Program

## 2018-01-24 VITALS — BP 138/90 | HR 90 | Temp 98.6°F | Resp 16 | Ht 70.0 in | Wt 275.0 lb

## 2018-01-24 DIAGNOSIS — Z9889 Other specified postprocedural states: Secondary | ICD-10-CM

## 2018-01-24 DIAGNOSIS — M545 Low back pain: Secondary | ICD-10-CM | POA: Diagnosis not present

## 2018-01-24 DIAGNOSIS — G5602 Carpal tunnel syndrome, left upper limb: Secondary | ICD-10-CM | POA: Insufficient documentation

## 2018-01-24 DIAGNOSIS — G894 Chronic pain syndrome: Secondary | ICD-10-CM | POA: Insufficient documentation

## 2018-01-24 DIAGNOSIS — G8929 Other chronic pain: Secondary | ICD-10-CM

## 2018-01-24 DIAGNOSIS — Z6839 Body mass index (BMI) 39.0-39.9, adult: Secondary | ICD-10-CM | POA: Diagnosis not present

## 2018-01-24 DIAGNOSIS — M1711 Unilateral primary osteoarthritis, right knee: Secondary | ICD-10-CM | POA: Insufficient documentation

## 2018-01-24 DIAGNOSIS — Z888 Allergy status to other drugs, medicaments and biological substances status: Secondary | ICD-10-CM | POA: Diagnosis not present

## 2018-01-24 DIAGNOSIS — M25561 Pain in right knee: Secondary | ICD-10-CM

## 2018-01-24 MED ORDER — FENTANYL CITRATE (PF) 100 MCG/2ML IJ SOLN
25.0000 ug | INTRAMUSCULAR | Status: DC | PRN
  Filled 2018-01-24: qty 2

## 2018-01-24 MED ORDER — LACTATED RINGERS IV SOLN: 1000.0000 mL | Freq: Once | INTRAVENOUS | Status: DC

## 2018-01-24 MED ORDER — ROPIVACAINE HCL 2 MG/ML IJ SOLN
INTRAMUSCULAR | Status: AC
  Filled 2018-01-24: qty 10

## 2018-01-24 MED ORDER — DEXAMETHASONE SODIUM PHOSPHATE 10 MG/ML IJ SOLN
10.0000 mg | Freq: Once | INTRAMUSCULAR | Status: AC
  Administered 2018-01-24: 10 mg
  Filled 2018-01-24: qty 1

## 2018-01-24 MED ORDER — OXYCODONE HCL 5 MG PO TABS: 5.0000 mg | ORAL_TABLET | Freq: Three times a day (TID) | ORAL | 0 refills | Status: DC | PRN

## 2018-01-24 MED ORDER — ROPIVACAINE HCL 2 MG/ML IJ SOLN
10.0000 mL | Freq: Once | INTRAMUSCULAR | Status: AC
  Administered 2018-01-24: 10 mL
  Filled 2018-01-24: qty 10

## 2018-01-24 MED ORDER — LIDOCAINE HCL 1 % IJ SOLN
10.0000 mL | Freq: Once | INTRAMUSCULAR | Status: AC
  Administered 2018-01-24: 10 mL
  Filled 2018-01-24: qty 10

## 2018-01-24 NOTE — Progress Notes (Signed)
Patient's Name: Terri Jennings  MRN: [id number]  Referring Provider: Edward Jolly, MD  DOB: 11/04/1956  PCP: [facility]  DOS: 01/24/2018  Note by: Edward Jolly, MD  Service setting: Ambulatory outpatient  Specialty: Interventional Pain Management  Patient type: Established  Location: [facility] (AMB) Pain Management Facility  Visit type: Interventional Procedure   Primary Reason for Visit: Interventional Pain Management Treatment. CC: Knee Pain (right); Back Pain (lower right); and Wrist Pain (Left CTS)  Procedure:  Anesthesia, Analgesia, Anxiolysis:  Type: Genicular Nerves Block (Superior-lateral, Superior-medial, and Inferior-medial Genicular Nerves)          CPT: 64450      Primary Purpose: Diagnostic Region: Lateral, Anterior, and Medial aspects of the knee joint, above and below the knee joint proper. Level: Superior and inferior to the knee joint. Target Area: For Genicular Nerve block(s), the targets are: the superior-lateral genicular nerve, located in the lateral distal portion of the femoral shaft as it curves to form the lateral epicondyle, in the region of the distal femoral metaphysis; the superior-medial genicular nerve, located in the medial distal portion of the femoral shaft as it curves to form the medial epicondyle; and the inferior-medial genicular nerve, located in the medial, proximal portion of the tibial shaft, as it curves to form the medial epicondyle, in the region of the proximal tibial metaphysis. Approach: Anterior, percutaneous, ipsilateral approach. Laterality: Right knee Position: Modified Fowler's position with pillows under the targeted knee(s).  Type: Local Anesthesia Local Anesthetic: Lidocaine 1% Route: Infiltration ([place]/IM) IV Access: Declined Sedation: Declined  Indication(s): Analgesia and Anxiety   Indications: 1. Primary osteoarthritis of right knee   2. Chronic pain of right knee   3. H/O right knee surgery   4. Morbid  obesity (HCC)   5. Chronic pain syndrome    Pain Score: Pre-procedure: 8 /10 Post-procedure: 0-No pain(Numb)/10  Pre-op Assessment:  Terri Jennings is a 62 y.o. (year old), female patient, seen today for interventional treatment. She  has a past surgical history that includes Knee surgery (Right, 2009) and Cataract extraction w/PHACO (Right, 03/18/2017). Terri Jennings has a current medication list which includes the following prescription(s): amlodipine besylate, atorvastatin, gabapentin, lisinopril, naproxen sodium, tizanidine, and oxycodone, and the following Facility-Administered Medications: fentanyl and lactated ringers. Her primarily concern today is the Knee Pain (right); Back Pain (lower right); and Wrist Pain (Left CTS)  Initial Vital Signs:  Pulse Rate: 90 Temp: 98.6 F (37 C) Resp: 16 BP: 124/80 SpO2: 100 %  BMI: Estimated body mass index is 39.46 kg/m as calculated from the following:   Height as of this encounter: 5\' 10"  (1.778 m).   Weight as of this encounter: 275 lb (124.7 kg).  Risk Assessment: Allergies: Reviewed. She is allergic to morphine and related; oxycontin [oxycodone hcl]; tramadol; and hydrocodone.  Allergy Precautions: None required Coagulopathies: Reviewed. None identified.  Blood-thinner therapy: None at this time Active Infection(s): Reviewed. None identified. Terri Jennings is afebrile  Site Confirmation: Terri Jennings was asked to confirm the procedure and laterality before marking the site Procedure checklist: Completed Consent: Before the procedure and under the influence of no sedative(s), amnesic(s), or anxiolytics, the patient was informed of the treatment options, risks and possible complications. To fulfill our ethical and legal obligations, as recommended by the American Medical Association's Code of Ethics, I have informed the patient of my clinical impression; the nature and purpose of the treatment or procedure; the risks, benefits, and possible  complications of the intervention; the alternatives,  including doing nothing; the risk(s) and benefit(s) of the alternative treatment(s) or procedure(s); and the risk(s) and benefit(s) of doing nothing. The patient was provided information about the general risks and possible complications associated with the procedure. These may include, but are not limited to: failure to achieve desired goals, infection, bleeding, organ or nerve damage, allergic reactions, paralysis, and death. In addition, the patient was informed of those risks and complications associated to the procedure, such as failure to decrease pain; infection; bleeding; organ or nerve damage with subsequent damage to sensory, motor, and/or autonomic systems, resulting in permanent pain, numbness, and/or weakness of one or several areas of the body; allergic reactions; (i.e.: anaphylactic reaction); and/or death. Furthermore, the patient was informed of those risks and complications associated with the medications. These include, but are not limited to: allergic reactions (i.e.: anaphylactic or anaphylactoid reaction(s)); adrenal axis suppression; blood sugar elevation that in diabetics may result in ketoacidosis or comma; water retention that in patients with history of congestive heart failure may result in shortness of breath, pulmonary edema, and decompensation with resultant heart failure; weight gain; swelling or edema; medication-induced neural toxicity; particulate matter embolism and blood vessel occlusion with resultant organ, and/or nervous system infarction; and/or aseptic necrosis of one or more joints. Finally, the patient was informed that Medicine is not an exact science; therefore, there is also the possibility of unforeseen or unpredictable risks and/or possible complications that may result in a catastrophic outcome. The patient indicated having understood very clearly. We have given the patient no guarantees and we have made no  promises. Enough time was given to the patient to ask questions, all of which were answered to the patient's satisfaction. Terri Jennings has indicated that she wanted to continue with the procedure. Attestation: I, the ordering provider, attest that I have discussed with the patient the benefits, risks, side-effects, alternatives, likelihood of achieving goals, and potential problems during recovery for the procedure that I have provided informed consent. Date  Time: 01/24/2018 11:02 AM  Pre-Procedure Preparation:  Monitoring: As per clinic protocol. Respiration, ETCO2, SpO2, BP, heart rate and rhythm monitor placed and checked for adequate function Safety Precautions: Patient was assessed for positional comfort and pressure points before starting the procedure. Time-out: I initiated and conducted the "Time-out" before starting the procedure, as per protocol. The patient was asked to participate by confirming the accuracy of the "Time Out" information. Verification of the correct person, site, and procedure were performed and confirmed by me, the nursing staff, and the patient. "Time-out" conducted as per Joint Commission's Universal Protocol (UP.01.01.01). Time: 1133  Description of Procedure Process:  Area Prepped: Entire knee area, from mid-thigh to mid-shin, lateral, anterior, and medial aspects. Prepping solution: ChloraPrep (2% chlorhexidine gluconate and 70% isopropyl alcohol) Safety Precautions: Aspiration looking for blood return was conducted prior to all injections. At no point did we inject any substances, as a needle was being advanced. No attempts were made at seeking any paresthesias. Safe injection practices and needle disposal techniques used. Medications properly checked for expiration dates. SDV (single dose vial) medications used. Latex Allergy precautions taken.   Description of the Procedure: Protocol guidelines were followed. The patient was placed in position over the procedure  table. The target area was identified and the area prepped in the usual manner. Skin desensitized using vapocoolant spray. Skin & deeper tissues infiltrated with local anesthetic. Appropriate amount of time allowed to pass for local anesthetics to take effect. The procedure needles were then advanced to the  target area. Proper needle placement secured. Negative aspiration confirmed. Solution injected in intermittent fashion, asking for systemic symptoms every 0.5cc of injectate. The needles were then removed and the area cleansed, making sure to leave some of the prepping solution back to take advantage of its long term bactericidal properties.  Vitals:   01/24/18 1117 01/24/18 1130 01/24/18 1140 01/24/18 1147  BP: 124/80 (!) 144/95 136/89 138/90  Pulse: 90 88 89 90  Resp: 16 16 18 16   Temp: 98.6 F (37 C)     TempSrc: Oral     SpO2: 100% 100% 100% 100%  Weight: 275 lb (124.7 kg)     Height: 5\' 10"  (1.778 m)       Start Time: 1133 hrs. End Time: 1142 hrs. Materials:  Needle(s) Type: Regular needle Gauge: 22G Length: 3.5-in Medication(s): Please see orders for medications and dosing details. 5 cc solution made of 4 cc of 0.2% ropivacaine, 1 cc of Decadron 10 mg/cc.  1-1.5 cc injected at each level. Imaging Guidance (Non-Spinal):  Type of Imaging Technique: Fluoroscopy Guidance (Non-Spinal) Indication(s): Assistance in needle guidance and placement for procedures requiring needle placement in or near specific anatomical locations not easily accessible without such assistance. Exposure Time: Please see nurses notes. Contrast: Before injecting any contrast, we confirmed that the patient did not have an allergy to iodine, shellfish, or radiological contrast. Once satisfactory needle placement was completed at the desired level, radiological contrast was injected. Contrast injected under live fluoroscopy. No contrast complications. See chart for type and volume of contrast used. Fluoroscopic  Guidance: I was personally present during the use of fluoroscopy. "Tunnel Vision Technique" used to obtain the best possible view of the target area. Parallax error corrected before commencing the procedure. "Direction-depth-direction" technique used to introduce the needle under continuous pulsed fluoroscopy. Once target was reached, antero-posterior, oblique, and lateral fluoroscopic projection used confirm needle placement in all planes. Images permanently stored in EMR. Interpretation: I personally interpreted the imaging intraoperatively. Adequate needle placement confirmed in multiple planes. Appropriate spread of contrast into desired area was observed. No evidence of afferent or efferent intravascular uptake. Permanent images saved into the patient's record.  Antibiotic Prophylaxis:   Anti-infectives (From admission, onward)   None     Indication(s): None identified  Post-operative Assessment:  Post-procedure Vital Signs:  Pulse Rate: 90 Temp: 98.6 F (37 C) Resp: 16 BP: 138/90 SpO2: 100 %  EBL: None  Complications: No immediate post-treatment complications observed by team, or reported by patient.  Note: The patient tolerated the entire procedure well. A repeat set of vitals were taken after the procedure and the patient was kept under observation following institutional policy, for this type of procedure. Post-procedural neurological assessment was performed, showing return to baseline, prior to discharge. The patient was provided with post-procedure discharge instructions, including a section on how to identify potential problems. Should any problems arise concerning this procedure, the patient was given instructions to immediately contact us, at any time, without hesitation. In any case, we plan to contact the patient by telephone for a follow-up status report regarding this interventional procedure.  Comments:  No additional relevant information. 5 out of 5 strength bilateral  lower extremity: Plantar flexion, dorsiflexion, knee flexion, knee extension.  Plan of Care   We will also have patient sign opioid agreement.  UDS checked and appropriate from last visit.  Patient has seen pain psychology and is deemed low risk for opioid misuse abuse.  No [place] PMP checked and appropriate.  Last prescription for Percocet was 10/09/2017.  Prescription for oxycodone below 5 mg 3 times daily as needed, quantity 19-month.   Imaging Orders     DG C-Arm 1-60 Min-No Report Procedure Orders    No procedure(s) ordered today    Medications ordered for procedure: Meds ordered this encounter  Medications  . lactated ringers infusion 1,000 mL  . fentaNYL (SUBLIMAZE) injection 25-100 mcg    Make sure Narcan is available in the pyxis when using this medication. In the event of respiratory depression (RR< 8/min): Titrate NARCAN (naloxone) in increments of 0.1 to 0.2 mg IV at 2-3 minute intervals, until desired degree of reversal.  . lidocaine (XYLOCAINE) 1 % (with pres) injection 10 mL  . ropivacaine (PF) 2 mg/mL (0.2%) (NAROPIN) injection 10 mL  . dexamethasone (DECADRON) injection 10 mg  . oxyCODONE (OXY IR/ROXICODONE) 5 MG immediate release tablet    Sig: Take 1 tablet (5 mg total) by mouth 3 (three) times daily as needed for severe pain. For chronic pain To last for 30 day from fill date    Dispense:  90 tablet    Refill:  0    Do not place this medication, or any other prescription from our practice, on "Automatic Refill". Patient may have prescription filled one day early if pharmacy is closed on scheduled refill date. Do not fill until:  To last until:   Medications administered: We administered lidocaine, ropivacaine (PF) 2 mg/mL (0.2%), and dexamethasone.  See the medical record for exact dosing, route, and time of administration.  New Prescriptions   OXYCODONE (OXY IR/ROXICODONE) 5 MG IMMEDIATE RELEASE TABLET    Take 1 tablet (5 mg total) by mouth 3 (three)  times daily as needed for severe pain. For chronic pain To last for 30 day from fill date   Disposition: Discharge home  Discharge Date & Time: 01/24/2018; 1148 hrs.   Physician-requested Follow-up: Return in about 4 weeks (around 02/21/2018) for Medication Management, Post Procedure Evaluation.  Future Appointments  Date Time Provider Department Center  02/21/2018 10:00 AM Edward Jolly, MD [facility] None   Primary Care Physician: [facility] Location: [facility] Outpatient Pain Management Facility Note by: Edward Jolly, MD Date: 01/24/2018; Time: 12:13 PM  Disclaimer:  Medicine is not an exact science. The only guarantee in medicine is that nothing is guaranteed. It is important to note that the decision to proceed with this intervention was based on the information collected from the patient. The Data and conclusions were drawn from the patient's questionnaire, the interview, and the physical examination. Because the information was provided in large part by the patient, it cannot be guaranteed that it has not been purposely or unconsciously manipulated. Every effort has been made to obtain as much relevant data as possible for this evaluation. It is important to note that the conclusions that lead to this procedure are derived in large part from the available data. Always take into account that the treatment will also be dependent on availability of resources and existing treatment guidelines, considered by other Pain Management Practitioners as being common knowledge and practice, at the time of the intervention. For Medico-Legal purposes, it is also important to point out that variation in procedural techniques and pharmacological choices are the acceptable norm. The indications, contraindications, technique, and results of the above procedure should only be interpreted and judged by a Board-Certified Interventional Pain Specialist with extensive familiarity and expertise in the same  exact procedure and technique.

## 2018-01-25 ENCOUNTER — Telehealth: Payer: Self-pay | Admitting: Student in an Organized Health Care Education/Training Program

## 2018-01-25 ENCOUNTER — Telehealth: Payer: Self-pay

## 2018-01-25 NOTE — Telephone Encounter (Signed)
Pharmacy lvmail this morning asking for PA on patients script. Please call

## 2018-01-25 NOTE — Telephone Encounter (Signed)
Kori completeing PA

## 2018-01-25 NOTE — Telephone Encounter (Signed)
Post procedure phone call.  Patient states she needs a prior authorization done.  Will send today.

## 2018-02-21 ENCOUNTER — Encounter: Payer: Self-pay | Admitting: Student in an Organized Health Care Education/Training Program

## 2018-02-21 ENCOUNTER — Other Ambulatory Visit: Payer: Self-pay

## 2018-02-21 ENCOUNTER — Ambulatory Visit
Payer: Medicaid Other | Attending: Student in an Organized Health Care Education/Training Program | Admitting: Student in an Organized Health Care Education/Training Program

## 2018-02-21 VITALS — BP 145/90 | HR 100 | Temp 98.4°F | Resp 16 | Ht 70.0 in | Wt 275.0 lb

## 2018-02-21 DIAGNOSIS — Z6839 Body mass index (BMI) 39.0-39.9, adult: Secondary | ICD-10-CM | POA: Insufficient documentation

## 2018-02-21 DIAGNOSIS — Z9889 Other specified postprocedural states: Secondary | ICD-10-CM

## 2018-02-21 DIAGNOSIS — M1711 Unilateral primary osteoarthritis, right knee: Secondary | ICD-10-CM | POA: Diagnosis not present

## 2018-02-21 DIAGNOSIS — Z888 Allergy status to other drugs, medicaments and biological substances status: Secondary | ICD-10-CM | POA: Diagnosis not present

## 2018-02-21 DIAGNOSIS — Z885 Allergy status to narcotic agent status: Secondary | ICD-10-CM | POA: Insufficient documentation

## 2018-02-21 DIAGNOSIS — M25561 Pain in right knee: Secondary | ICD-10-CM | POA: Diagnosis not present

## 2018-02-21 DIAGNOSIS — Z79899 Other long term (current) drug therapy: Secondary | ICD-10-CM | POA: Diagnosis not present

## 2018-02-21 DIAGNOSIS — M5136 Other intervertebral disc degeneration, lumbar region: Secondary | ICD-10-CM | POA: Diagnosis not present

## 2018-02-21 DIAGNOSIS — Z5181 Encounter for therapeutic drug level monitoring: Secondary | ICD-10-CM | POA: Insufficient documentation

## 2018-02-21 DIAGNOSIS — M47816 Spondylosis without myelopathy or radiculopathy, lumbar region: Secondary | ICD-10-CM

## 2018-02-21 DIAGNOSIS — F329 Major depressive disorder, single episode, unspecified: Secondary | ICD-10-CM | POA: Diagnosis not present

## 2018-02-21 DIAGNOSIS — G894 Chronic pain syndrome: Secondary | ICD-10-CM

## 2018-02-21 DIAGNOSIS — G8929 Other chronic pain: Secondary | ICD-10-CM | POA: Diagnosis not present

## 2018-02-21 MED ORDER — GABAPENTIN 300 MG PO CAPS: ORAL_CAPSULE | ORAL | 2 refills | Status: DC

## 2018-02-21 MED ORDER — OXYCODONE HCL 5 MG PO TABS: 5.0000 mg | ORAL_TABLET | Freq: Three times a day (TID) | ORAL | 0 refills | Status: DC | PRN

## 2018-02-21 NOTE — Patient Instructions (Addendum)
You have been given 2 Rx for Oxycodone IR to last until 04/22/2018.  1.  Increase gabapentin so that you are taking 300 mg or the day and 600 mg at night 2.  Refill of Percocet as below for 2 months.
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
## 2018-03-09 ENCOUNTER — Other Ambulatory Visit: Payer: Self-pay

## 2018-03-09 ENCOUNTER — Encounter: Payer: Self-pay | Admitting: Emergency Medicine

## 2018-03-09 ENCOUNTER — Emergency Department: Payer: Medicaid Other

## 2018-03-09 ENCOUNTER — Emergency Department
Admission: EM | Admit: 2018-03-09 | Discharge: 2018-03-09 | Disposition: A | Discharge status: Home / Self Care | Payer: Medicaid Other | Attending: Emergency Medicine | Admitting: Emergency Medicine

## 2018-03-09 DIAGNOSIS — M79605 Pain in left leg: Secondary | ICD-10-CM | POA: Diagnosis present

## 2018-03-09 DIAGNOSIS — I10 Essential (primary) hypertension: Secondary | ICD-10-CM | POA: Insufficient documentation

## 2018-03-09 DIAGNOSIS — Z79899 Other long term (current) drug therapy: Secondary | ICD-10-CM | POA: Insufficient documentation

## 2018-03-09 MED ORDER — MELOXICAM 15 MG PO TABS: 15.0000 mg | ORAL_TABLET | Freq: Every day | ORAL | 0 refills | Status: AC

## 2018-03-09 MED ORDER — MELOXICAM 7.5 MG PO TABS
15.0000 mg | ORAL_TABLET | Freq: Once | ORAL | Status: AC
  Administered 2018-03-09: 15 mg via ORAL
  Filled 2018-03-09: qty 2

## 2018-03-09 MED ORDER — CYCLOBENZAPRINE HCL 5 MG PO TABS: ORAL_TABLET | ORAL | 0 refills | Status: DC

## 2018-03-09 NOTE — ED Triage Notes (Addendum)
Pt to ED via POV with c/o LFT leg pain after possibly stepping down steps wrong today. No deformity noted. VSS . Pt unable to say if pain is in ankle or knee therefore no standing orders placed at this time

## 2018-03-09 NOTE — ED Notes (Signed)
Pt ambulatory without difficulty. VSS. NAD. Discharge instructions, RX and follow up discussed. All questions addressed.

## 2018-03-09 NOTE — ED Provider Notes (Addendum)
[facility] Emergency Department Provider Note  ____________________________________________  Time seen: Approximately 8:32 PM  I have reviewed the triage vital signs and the nursing notes.   HISTORY  Chief Complaint Leg Pain    HPI Terri Jennings is a 62 y.o. female with PMH HTN, chronic back pain, chronic knee pain that presents to the emergency department for evaluation of left lower leg pain for 1 day. Pain is worse to her calf and worse with walking.  Patient also has a bruise to outer left foot.  She recalls stubbing foot on an object on Monday. No trauma to knee or ankle.  Patient does not smoke.  She is not on any form of birth control.  No recent surgeries.  No recent travel.  No nausea, vomiting, numbness, tingling.   Past Medical History:  Diagnosis Date  . Arthritis   . Dysrhythmia   . Enlarged heart   . Hypertension     Patient Active Problem List   Diagnosis Date Noted  . Primary osteoarthritis of right knee 02/21/2018  . Morbid obesity (HCC) 02/21/2018  . Chronic pain syndrome 02/21/2018  . Chronic radicular low back pain 01/22/2015  . Chronic knee pain 01/22/2015    Past Surgical History:  Procedure Laterality Date  . CATARACT EXTRACTION W/PHACO Right 03/18/2017   Procedure: CATARACT EXTRACTION PHACO AND INTRAOCULAR LENS PLACEMENT (IOC);  Surgeon: Nevada Crane, MD;  Location: [facility] ORS;  Service: Ophthalmology;  Laterality: Right;  [place] 1:00.5 AP% 12.5 CDE 7.78 FLUID PACK LOT # [id number] H  . KNEE SURGERY Right 2009    Prior to Admission medications   Medication Sig Start Date End Date Taking? Authorizing Provider  AMLODIPINE BESYLATE PO Take 10 mg by mouth.     [provider]  atorvastatin (LIPITOR) 10 MG tablet Take 10 mg by mouth daily.    [provider]  cyclobenzaprine (FLEXERIL) 5 MG tablet Take 1-2 tablets 3 times daily as needed 03/09/18   Enid Derry, PA-C  gabapentin (NEURONTIN) 300 MG  capsule 300 mg qday, 600 mg qhs 02/21/18   Edward Jolly, MD  lisinopril (PRINIVIL,ZESTRIL) 20 MG tablet Take 25 mg by mouth daily.     [provider]  meloxicam (MOBIC) 15 MG tablet Take 1 tablet (15 mg total) by mouth daily for 10 days. 03/09/18 03/19/18  Enid Derry, PA-C  naproxen sodium (ALEVE) 220 MG tablet Take 220 mg by mouth daily as needed.    [provider]  oxyCODONE (OXY IR/ROXICODONE) 5 MG immediate release tablet Take 1 tablet (5 mg total) by mouth 3 (three) times daily as needed for severe pain. For chronic pain To last for 30 day from fill date To fill on or after: 02/21/18, 03/23/18 02/21/18   Edward Jolly, MD  tiZANidine (ZANAFLEX) 4 MG tablet Take 1 tablet (4 mg total) by mouth 2 (two) times daily as needed for muscle spasms. 12/28/17 12/28/18  Edward Jolly, MD    Allergies Morphine and related; Oxycontin [oxycodone hcl]; Tramadol; and Hydrocodone  Family History  Problem Relation Age of Onset  . Stroke Father   . Cancer Maternal Grandmother     Social History Social History   Tobacco Use  . Smoking status: Never Smoker  . Smokeless tobacco: Never Used  Substance Use Topics  . Alcohol use: No    Alcohol/week: 0.0 oz  . Drug use: No     Review of Systems  Constitutional: No fever/chills Cardiovascular: No chest pain. Respiratory: No SOB. Gastrointestinal:  No abdominal pain.  No nausea, no vomiting.  Musculoskeletal: Positive for leg pain. Skin: Negative for rash, abrasions, lacerations.  Positive for ecchymosis. Neurological: Negative for numbness or tingling   ____________________________________________   PHYSICAL EXAM:  VITAL SIGNS: ED Triage Vitals [03/09/18 1727]  Enc Vitals Group     BP 139/80     Pulse Rate 88     Resp 16     Temp 97.9 F (36.6 C)     Temp Source Oral     SpO2 95 %     Weight 220 lb (99.8 kg)     Height 5\' 10"  (1.778 m)     Head Circumference      Peak Flow      Pain Score 10     Pain Loc       Pain Edu?      Excl. in GC?      Constitutional: Alert and oriented. Well appearing and in no acute distress. Eyes: Conjunctivae are normal. PERRL. EOMI. Head: Atraumatic. ENT:      Ears:      Nose: No congestion/rhinnorhea.      Mouth/Throat: Mucous membranes are moist.  Neck: No stridor.  Cardiovascular: Normal rate, regular rhythm.  Good peripheral circulation.   Palpable dorsalis pedis pulses bilaterally. Cap refill less than 3 seconds. Respiratory: Normal respiratory effort without tachypnea or retractions. Lungs CTAB. Good air entry to the bases with no decreased or absent breath sounds. Musculoskeletal: Full range of motion to all extremities. No gross deformities appreciated. Tenderness to palpation to popliteal fossa and calf. Pain increased with plantar flexion.  Neurologic:  Normal speech and language. No gross focal neurologic deficits are appreciated.  Skin:  Skin is warm, dry and intact. 2 inch area of ecchymosis to distal lateral left foot extending into the plantar surface. Psychiatric: Mood and affect are normal. Speech and behavior are normal. Patient exhibits appropriate insight and judgement.   ____________________________________________   LABS (all labs ordered are listed, but only abnormal results are displayed)  Labs Reviewed - No data to display ____________________________________________  EKG  NSR ____________________________________________  RADIOLOGY Lexine Baton, personally viewed and evaluated these images (plain radiographs) as part of my medical decision making, as well as reviewing the written report by the radiologist.  US Venous Img Lower Unilateral Left  Result Date: 03/09/2018 CLINICAL DATA:  Left lower extremity pain after injury today. EXAM: LEFT LOWER EXTREMITY VENOUS DOPPLER ULTRASOUND TECHNIQUE: Gray-scale sonography with graded compression, as well as color Doppler and duplex ultrasound were performed to evaluate the lower  extremity deep venous systems from the level of the common femoral vein and including the common femoral, femoral, profunda femoral, popliteal and calf veins including the posterior tibial, peroneal and gastrocnemius veins when visible. The superficial great saphenous vein was also interrogated. Spectral Doppler was utilized to evaluate flow at rest and with distal augmentation maneuvers in the common femoral, femoral and popliteal veins. COMPARISON:  None. FINDINGS: Contralateral Common Femoral Vein: Respiratory phasicity is normal and symmetric with the symptomatic side. No evidence of thrombus. Normal compressibility. Common Femoral Vein: No evidence of thrombus. Normal compressibility, respiratory phasicity and response to augmentation. Saphenofemoral Junction: No evidence of thrombus. Normal compressibility and flow on color Doppler imaging. Profunda Femoral Vein: No evidence of thrombus. Normal compressibility and flow on color Doppler imaging. Femoral Vein: No evidence of thrombus. Normal compressibility, respiratory phasicity and response to augmentation. Popliteal Vein: No evidence of thrombus. Normal compressibility, respiratory phasicity and response to  augmentation. Calf Veins: No evidence of thrombus. Normal compressibility and flow on color Doppler imaging. Superficial Great Saphenous Vein: No evidence of thrombus. Normal compressibility. Venous Reflux:  None. Other Findings:  None. IMPRESSION: No evidence of deep venous thrombosis in the left lower extremity. Electronically Signed   By: Delbert PhenixJason A Poff M.D.   On: 03/09/2018 20:05   Dg Foot Complete Left  Result Date: 03/09/2018 CLINICAL DATA:  Left foot pain.  No injury. EXAM: LEFT FOOT - COMPLETE 3+ VIEW COMPARISON:  None. FINDINGS: No fracture.  No bone lesion. The joints are normally spaced and aligned. Small plantar calcaneal spur. Soft tissues are unremarkable. IMPRESSION: 1. No fracture or joint abnormality. 2. Small plantar calcaneal spur  Electronically Signed   By: Amie Portlandavid  Ormond M.D.   On: 03/09/2018 19:21    ____________________________________________    PROCEDURES  Procedure(s) performed:    Procedures    Medications  meloxicam (MOBIC) tablet 15 mg (15 mg Oral Given 03/09/18 2111)     ____________________________________________   INITIAL IMPRESSION / ASSESSMENT AND PLAN / ED COURSE  Pertinent labs & imaging results that were available during my care of the patient were reviewed by me and considered in my medical decision making (see chart for details).  Review of the [place] CSRS was performed in accordance of the [facility] prior to dispensing any controlled drugs.   Patient presented to the emergency department for evaluation of left lower leg pain for 1 day.  Vital signs and exam are reassuring.  Ultrasound negative for DVT.  No fracture on foot xray. Patient initially did not recall injuring foot and then remembers injuring foot earlier in the week. Dr. Marisa SeverinSiadecki was consulted and personally came to see and examine the patient. Dr. Marisa SeverinSiadecki recommended foot xray and ultrasound for DVT and does not recommend blood work or additional imaging.  Patient will be discharged home with prescriptions for meloxicam and flexeril. Patient is to follow up with PCP as directed. Patient is given ED precautions to return to the ED for any worsening or new symptoms.   ____________________________________________  FINAL CLINICAL IMPRESSION(S) / ED DIAGNOSES  Final diagnoses:  Left leg pain      NEW MEDICATIONS STARTED DURING THIS VISIT:  ED Discharge Orders        Ordered    meloxicam (MOBIC) 15 MG tablet  Daily     03/09/18 2105    cyclobenzaprine (FLEXERIL) 5 MG tablet     03/09/18 2105          This chart was dictated using voice recognition software/Dragon. Despite best efforts to proofread, errors can occur which can change the meaning. Any change was purely unintentional.     Enid DerryWagner, Geneveive Furness,  PA-C 03/09/18 2330    Enid DerryWagner, Ivyonna Hoelzel, PA-C 03/09/18 [id number]    Dionne BucySiadecki, Sebastian, MD 03/09/18 [phone]

## 2018-03-09 NOTE — ED Notes (Signed)
See triage note     Presents with pain to left lower leg  States she stepped wrong and felt pain  Did not fall but increases with standing  Unable to bear wt

## 2018-04-19 ENCOUNTER — Other Ambulatory Visit: Payer: Self-pay

## 2018-04-19 ENCOUNTER — Encounter: Payer: Self-pay | Admitting: Student in an Organized Health Care Education/Training Program

## 2018-04-19 ENCOUNTER — Ambulatory Visit
Payer: Medicaid Other | Attending: Student in an Organized Health Care Education/Training Program | Admitting: Student in an Organized Health Care Education/Training Program

## 2018-04-19 VITALS — BP 130/77 | HR 89 | Temp 98.4°F | Resp 18 | Ht 70.0 in

## 2018-04-19 DIAGNOSIS — Z9889 Other specified postprocedural states: Secondary | ICD-10-CM | POA: Diagnosis not present

## 2018-04-19 DIAGNOSIS — G8929 Other chronic pain: Secondary | ICD-10-CM

## 2018-04-19 DIAGNOSIS — Z888 Allergy status to other drugs, medicaments and biological substances status: Secondary | ICD-10-CM | POA: Diagnosis not present

## 2018-04-19 DIAGNOSIS — M25562 Pain in left knee: Secondary | ICD-10-CM

## 2018-04-19 DIAGNOSIS — Z79899 Other long term (current) drug therapy: Secondary | ICD-10-CM | POA: Insufficient documentation

## 2018-04-19 DIAGNOSIS — Z760 Encounter for issue of repeat prescription: Secondary | ICD-10-CM | POA: Insufficient documentation

## 2018-04-19 DIAGNOSIS — Z6831 Body mass index (BMI) 31.0-31.9, adult: Secondary | ICD-10-CM | POA: Insufficient documentation

## 2018-04-19 DIAGNOSIS — Z885 Allergy status to narcotic agent status: Secondary | ICD-10-CM | POA: Insufficient documentation

## 2018-04-19 DIAGNOSIS — G894 Chronic pain syndrome: Secondary | ICD-10-CM | POA: Insufficient documentation

## 2018-04-19 DIAGNOSIS — M1711 Unilateral primary osteoarthritis, right knee: Secondary | ICD-10-CM | POA: Insufficient documentation

## 2018-04-19 DIAGNOSIS — M25561 Pain in right knee: Secondary | ICD-10-CM

## 2018-04-19 MED ORDER — OXYCODONE HCL 5 MG PO TABS: 5.0000 mg | ORAL_TABLET | Freq: Three times a day (TID) | ORAL | 0 refills | Status: DC | PRN

## 2018-04-19 MED ORDER — DICLOFENAC SODIUM 75 MG PO TBEC: 75.0000 mg | DELAYED_RELEASE_TABLET | Freq: Two times a day (BID) | ORAL | 1 refills | Status: DC
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
## 2018-04-19 NOTE — Progress Notes (Signed)
Nursing Pain Medication Assessment:  Safety precautions to be maintained throughout the outpatient stay will include: orient to surroundings, keep bed in low position, maintain call bell within reach at all times, provide assistance with transfer out of bed and ambulation.  Medication Inspection Compliance: Pill count conducted under aseptic conditions, in front of the patient. Neither the pills nor the bottle was removed from the patient's sight at any time. Once count was completed pills were immediately returned to the patient in their original bottle.  Medication: Oxycodone IR Pill/Patch Count: 8 of 90 pills remain Pill/Patch Appearance: Markings consistent with prescribed medication Bottle Appearance: Standard pharmacy container. Clearly labeled. Filled Date: 04/24/ 2019 Last Medication intake:  Today

## 2018-04-19 NOTE — Patient Instructions (Signed)
You have been given 2 scripts for oxycodone today.  You have been instructed not to take any other Nsaids besides the diclofenac.

## 2018-05-10 ENCOUNTER — Telehealth: Payer: Self-pay | Admitting: Student in an Organized Health Care Education/Training Program

## 2018-05-10 NOTE — Telephone Encounter (Addendum)
Patient states she is going out of town on June 23 and will not be back for about 4 months. Says she spoke with Dr. Cherylann RatelLateef about this already. She will not be here for July 9 med refill appt. Dr. Cherylann RatelLateef has no openings on his schedule to move patient up. Patient Wants Dr. Cherylann RatelLateef to call her to discuss this.  Crystal is full on her schedule as well.

## 2018-05-11 NOTE — Telephone Encounter (Signed)
Dr. Lateef, Do you have any suggestions? 

## 2018-05-16 ENCOUNTER — Ambulatory Visit
Payer: Medicaid Other | Attending: Student in an Organized Health Care Education/Training Program | Admitting: Student in an Organized Health Care Education/Training Program

## 2018-05-16 ENCOUNTER — Encounter: Payer: Self-pay | Admitting: Student in an Organized Health Care Education/Training Program

## 2018-05-16 VITALS — BP 141/80 | HR 91 | Temp 98.4°F | Resp 16 | Ht 70.0 in | Wt 280.0 lb

## 2018-05-16 DIAGNOSIS — Z79899 Other long term (current) drug therapy: Secondary | ICD-10-CM | POA: Diagnosis not present

## 2018-05-16 DIAGNOSIS — Z6841 Body Mass Index (BMI) 40.0 and over, adult: Secondary | ICD-10-CM | POA: Diagnosis not present

## 2018-05-16 DIAGNOSIS — M25562 Pain in left knee: Secondary | ICD-10-CM | POA: Diagnosis not present

## 2018-05-16 DIAGNOSIS — M1711 Unilateral primary osteoarthritis, right knee: Secondary | ICD-10-CM | POA: Diagnosis not present

## 2018-05-16 DIAGNOSIS — M545 Low back pain: Secondary | ICD-10-CM | POA: Diagnosis present

## 2018-05-16 DIAGNOSIS — M7732 Calcaneal spur, left foot: Secondary | ICD-10-CM | POA: Insufficient documentation

## 2018-05-16 DIAGNOSIS — Z9889 Other specified postprocedural states: Secondary | ICD-10-CM | POA: Diagnosis not present

## 2018-05-16 DIAGNOSIS — M25561 Pain in right knee: Secondary | ICD-10-CM | POA: Diagnosis not present

## 2018-05-16 DIAGNOSIS — G8929 Other chronic pain: Secondary | ICD-10-CM | POA: Diagnosis not present

## 2018-05-16 DIAGNOSIS — M25532 Pain in left wrist: Secondary | ICD-10-CM | POA: Insufficient documentation

## 2018-05-16 DIAGNOSIS — M47816 Spondylosis without myelopathy or radiculopathy, lumbar region: Secondary | ICD-10-CM | POA: Insufficient documentation

## 2018-05-16 DIAGNOSIS — G894 Chronic pain syndrome: Secondary | ICD-10-CM | POA: Diagnosis not present

## 2018-05-16 DIAGNOSIS — M5136 Other intervertebral disc degeneration, lumbar region: Secondary | ICD-10-CM

## 2018-05-16 MED ORDER — OXYCODONE HCL 5 MG PO TABS: 5.0000 mg | ORAL_TABLET | Freq: Three times a day (TID) | ORAL | 0 refills | Status: DC | PRN

## 2018-05-16 MED ORDER — DICLOFENAC SODIUM 75 MG PO TBEC: 75.0000 mg | DELAYED_RELEASE_TABLET | Freq: Three times a day (TID) | ORAL | 2 refills | Status: DC | PRN

## 2018-05-16 MED ORDER — OXYCODONE HCL 10 MG PO TABS: 10.0000 mg | ORAL_TABLET | Freq: Three times a day (TID) | ORAL | 0 refills | Status: DC | PRN

## 2018-05-16 NOTE — Progress Notes (Signed)
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    Do not place this medication, or any other prescription from our practice, on "Automatic Refill". Patient may have prescription filled one day early if pharmacy is closed on scheduled refill date. Do not fill until:  To last until:  . diclofenac (VOLTAREN) 75 MG EC tablet    Sig: Take 1 tablet (75 mg total) by mouth 3 (three) times daily as needed.    Dispense:  90 tablet    Refill:  2    Provider-requested follow-up: Return in about 3 months (around 08/23/2018) for Medication Management.  Future Appointments  Date Time Provider [place]  08/23/2018  8:30 AM Gillis Santa, MD [facility] None    Primary Care Physician: [facility] [place] Location: [facility] Note by: Gillis Santa, M.D Date: 05/16/2018; Time: 2:34 PM  There are no Patient Instructions on file for this visit.

## 2018-05-16 NOTE — Progress Notes (Signed)
Nursing Pain Medication Assessment:  Safety precautions to be maintained throughout the outpatient stay will include: orient to surroundings, keep bed in low position, maintain call bell within reach at all times, provide assistance with transfer out of bed and ambulation.  Medication Inspection Compliance: Ms. Terri Jennings did not comply with our request to bring her pills to be counted. She was reminded that bringing the medication bottles, even when empty, is a requirement.  Medication: None brought in. Pill/Patch Count: None available to be counted. Bottle Appearance: No container available. Did not bring bottle(s) to appointment. Filled Date: N/A Last Medication intake:  Yesterday

## 2018-05-31 ENCOUNTER — Telehealth: Payer: Self-pay | Admitting: *Deleted

## 2018-05-31 NOTE — Telephone Encounter (Signed)
Spoke with pharmacist to clarify fax that was received for clarification.  They were just checking the sig on Diclofenac 75 mg tid.  I did check the note and there was no indication that he was increasing the medication other than the Rx that was sent which was different from last Rx written in May.  Pharmacist just wanted to verify this increase in dosing.  Information left for Dr Cherylann RatelLateef to review to clarify sig upon his return to the office on Monday June 06, 2018.

## 2018-06-07 ENCOUNTER — Encounter: Payer: Medicaid Other | Admitting: Student in an Organized Health Care Education/Training Program

## 2018-07-08 IMAGING — DX DG FOOT COMPLETE 3+V*L*
3 series · 3 of 3 positions shown · non-contrast
Comparison: None.

CLINICAL DATA: Left foot pain.  No injury.

EXAM:
LEFT FOOT - COMPLETE 3+ VIEW

[foot ap]
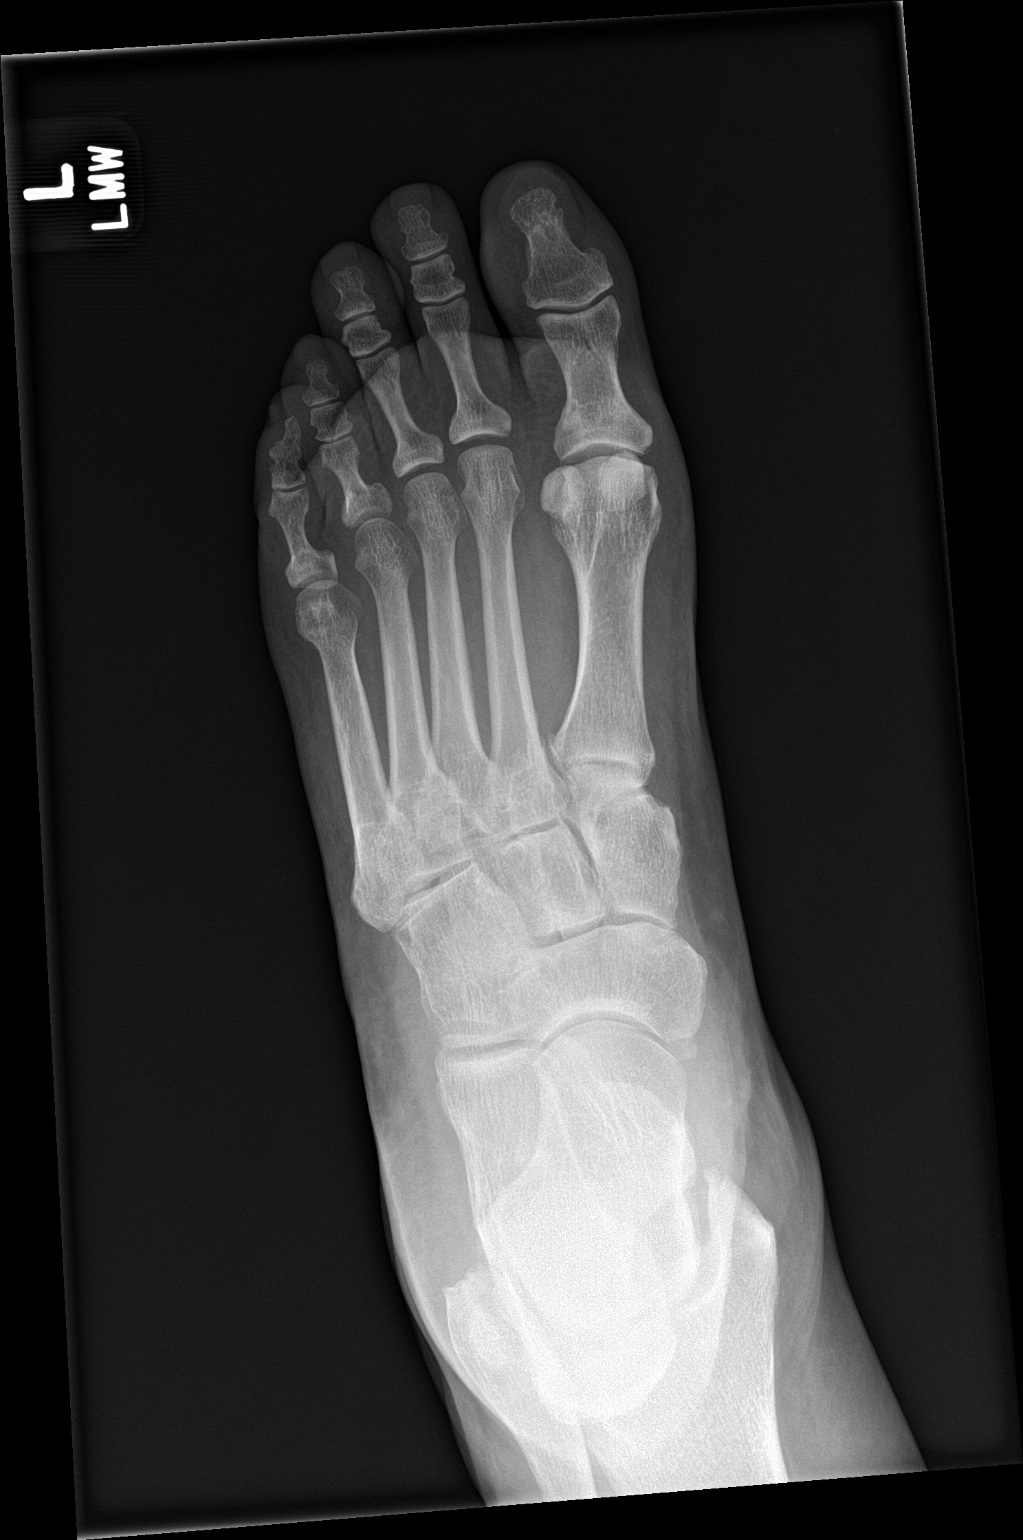

[foot obl]
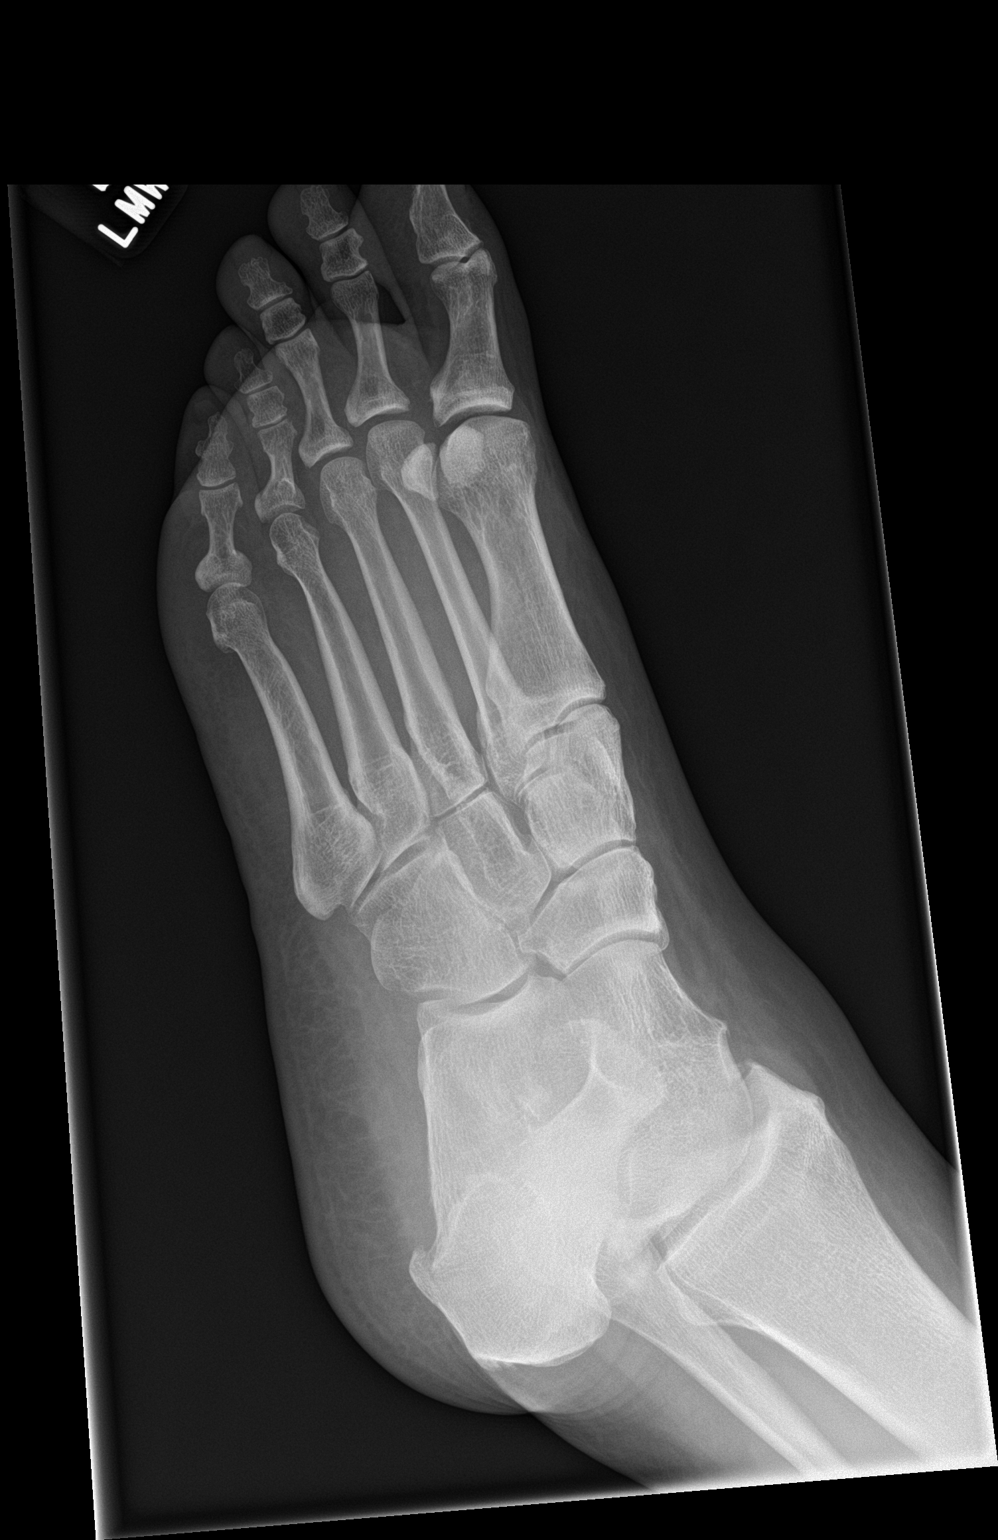

[foot lat]
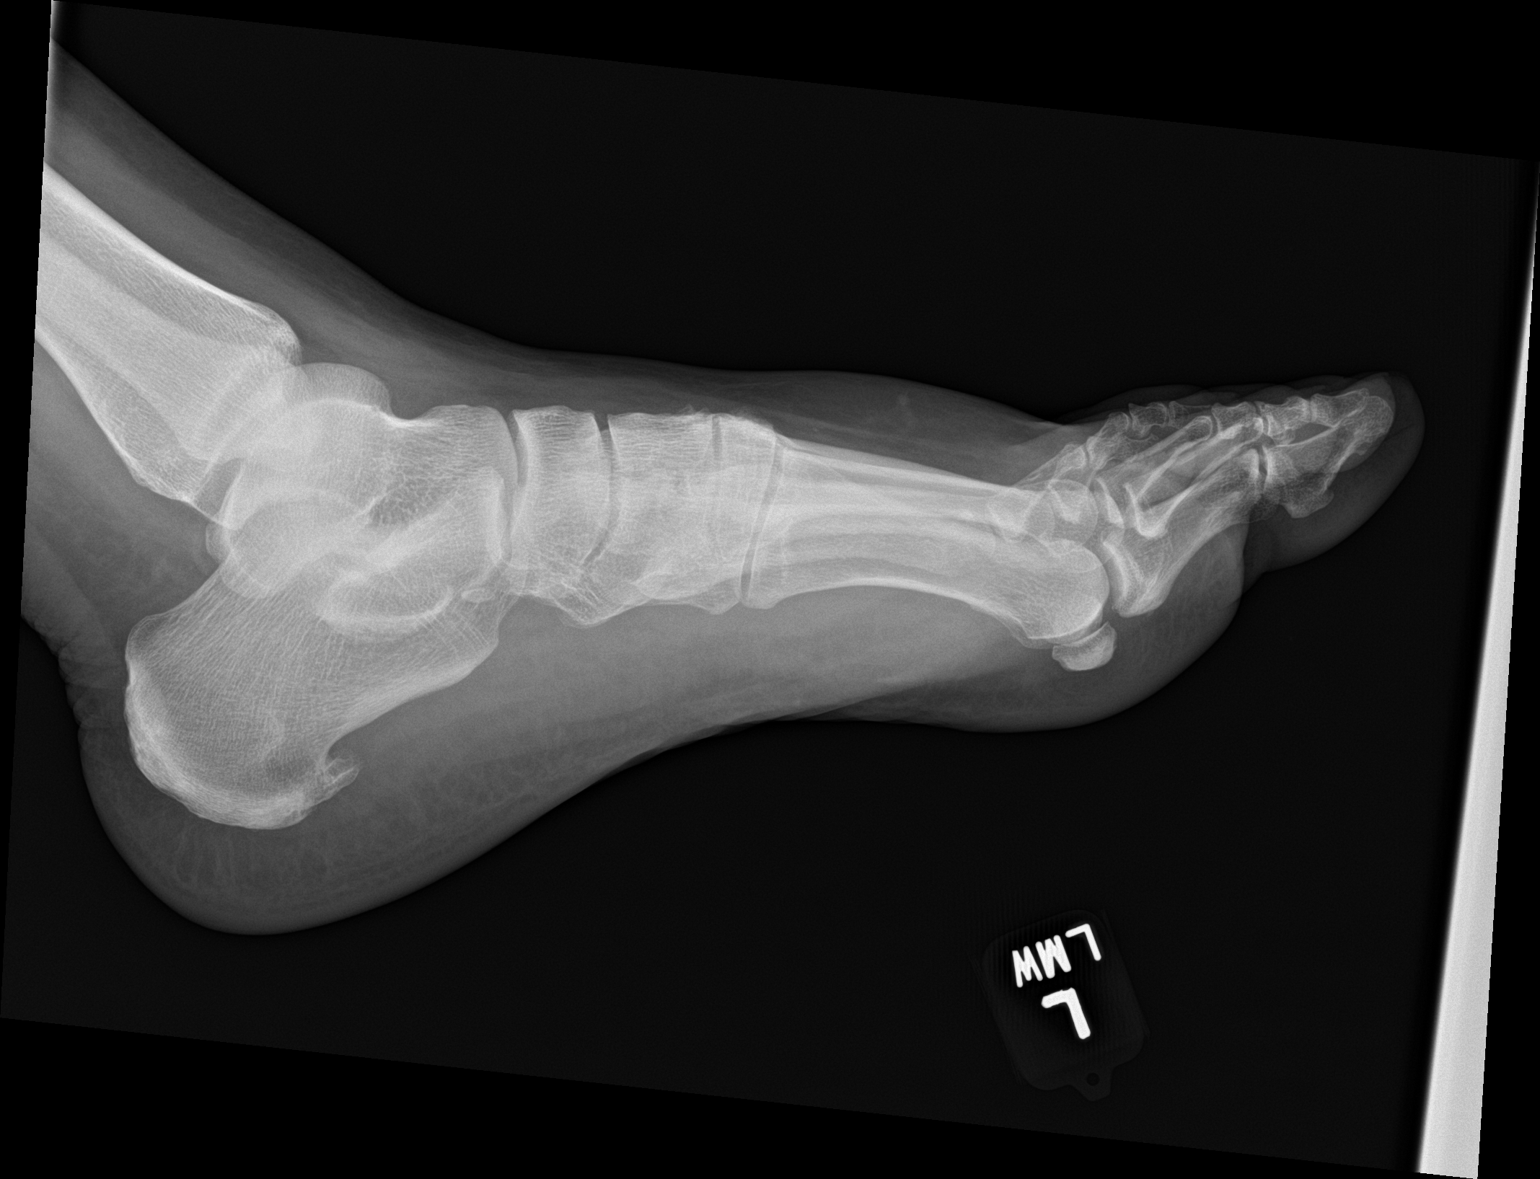

[3 of 3 positions shown; findings below may reference images not displayed]

FINDINGS: No fracture.  No bone lesion.

The joints are normally spaced and aligned.

Small plantar calcaneal spur.

Soft tissues are unremarkable.
IMPRESSION: 1. No fracture or joint abnormality.
2. Small plantar calcaneal spur

## 2018-07-19 ENCOUNTER — Other Ambulatory Visit: Payer: Self-pay | Admitting: Orthopedic Surgery

## 2018-07-19 DIAGNOSIS — M25562 Pain in left knee: Principal | ICD-10-CM

## 2018-07-19 DIAGNOSIS — G8929 Other chronic pain: Secondary | ICD-10-CM

## 2018-07-21 ENCOUNTER — Encounter: Payer: Self-pay | Admitting: Student in an Organized Health Care Education/Training Program

## 2018-07-21 ENCOUNTER — Ambulatory Visit
Payer: Medicaid Other | Attending: Student in an Organized Health Care Education/Training Program | Admitting: Student in an Organized Health Care Education/Training Program

## 2018-07-21 ENCOUNTER — Other Ambulatory Visit: Payer: Self-pay

## 2018-07-21 VITALS — BP 142/103 | HR 106 | Temp 98.2°F | Resp 16 | Ht 70.0 in | Wt 270.0 lb

## 2018-07-21 DIAGNOSIS — Z760 Encounter for issue of repeat prescription: Secondary | ICD-10-CM | POA: Diagnosis not present

## 2018-07-21 DIAGNOSIS — M1711 Unilateral primary osteoarthritis, right knee: Secondary | ICD-10-CM | POA: Insufficient documentation

## 2018-07-21 DIAGNOSIS — G894 Chronic pain syndrome: Secondary | ICD-10-CM | POA: Insufficient documentation

## 2018-07-21 DIAGNOSIS — Z5181 Encounter for therapeutic drug level monitoring: Secondary | ICD-10-CM | POA: Insufficient documentation

## 2018-07-21 DIAGNOSIS — M25562 Pain in left knee: Secondary | ICD-10-CM | POA: Diagnosis not present

## 2018-07-21 DIAGNOSIS — G8929 Other chronic pain: Secondary | ICD-10-CM

## 2018-07-21 DIAGNOSIS — M25561 Pain in right knee: Secondary | ICD-10-CM

## 2018-07-21 DIAGNOSIS — M5136 Other intervertebral disc degeneration, lumbar region: Secondary | ICD-10-CM | POA: Insufficient documentation

## 2018-07-21 DIAGNOSIS — M47816 Spondylosis without myelopathy or radiculopathy, lumbar region: Secondary | ICD-10-CM | POA: Diagnosis not present

## 2018-07-21 DIAGNOSIS — Z888 Allergy status to other drugs, medicaments and biological substances status: Secondary | ICD-10-CM | POA: Diagnosis not present

## 2018-07-21 DIAGNOSIS — I10 Essential (primary) hypertension: Secondary | ICD-10-CM | POA: Insufficient documentation

## 2018-07-21 DIAGNOSIS — Z885 Allergy status to narcotic agent status: Secondary | ICD-10-CM | POA: Insufficient documentation

## 2018-07-21 DIAGNOSIS — Z6838 Body mass index (BMI) 38.0-38.9, adult: Secondary | ICD-10-CM | POA: Insufficient documentation

## 2018-07-21 DIAGNOSIS — Z79899 Other long term (current) drug therapy: Secondary | ICD-10-CM | POA: Insufficient documentation

## 2018-07-21 DIAGNOSIS — Z9889 Other specified postprocedural states: Secondary | ICD-10-CM | POA: Diagnosis not present

## 2018-07-21 MED ORDER — OXYCODONE HCL 10 MG PO TABS: 5.0000 mg | ORAL_TABLET | Freq: Three times a day (TID) | ORAL | 0 refills | Status: DC | PRN

## 2018-07-21 MED ORDER — GABAPENTIN 300 MG PO CAPS: ORAL_CAPSULE | ORAL | 2 refills | Status: DC

## 2018-07-21 MED ORDER — DICLOFENAC SODIUM 75 MG PO TBEC: 75.0000 mg | DELAYED_RELEASE_TABLET | Freq: Two times a day (BID) | ORAL | 2 refills | Status: DC

## 2018-07-21 MED ORDER — BACLOFEN 10 MG PO TABS: 10.0000 mg | ORAL_TABLET | Freq: Three times a day (TID) | ORAL | 3 refills | Status: DC

## 2018-07-21 NOTE — Patient Instructions (Signed)
Baclofen, Diclofenac, gabapentin and oxycodone have been escribed to your pharmacy.

## 2018-07-21 NOTE — Progress Notes (Signed)
Nursing Pain Medication Assessment:  Safety precautions to be maintained throughout the outpatient stay will include: orient to surroundings, keep bed in low position, maintain call bell within reach at all times, provide assistance with transfer out of bed and ambulation.  Medication Inspection Compliance: Terri Jennings did not comply with our request to bring her pills to be counted. She was reminded that bringing the medication bottles, even when empty, is a requirement.  Medication: None brought in. Pill/Patch Count: None available to be counted. Bottle Appearance: No container available. Did not bring bottle(s) to appointment. Filled Date: N/A Last Medication intake:  Today 

## 2018-07-21 NOTE — Progress Notes (Signed)
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 pharmacy.

## 2018-08-09 ENCOUNTER — Ambulatory Visit
Admission: RE | Admit: 2018-08-09 | Discharge: 2018-08-09 | Disposition: A | Discharge status: Home / Self Care | Payer: Medicaid Other | Source: Ambulatory Visit | Attending: Orthopedic Surgery | Admitting: Orthopedic Surgery

## 2018-08-09 ENCOUNTER — Ambulatory Visit: Payer: Medicaid Other | Admitting: Student in an Organized Health Care Education/Training Program

## 2018-08-09 DIAGNOSIS — M899 Disorder of bone, unspecified: Secondary | ICD-10-CM | POA: Insufficient documentation

## 2018-08-09 DIAGNOSIS — X58XXXA Exposure to other specified factors, initial encounter: Secondary | ICD-10-CM | POA: Diagnosis not present

## 2018-08-09 DIAGNOSIS — R600 Localized edema: Secondary | ICD-10-CM | POA: Diagnosis not present

## 2018-08-09 DIAGNOSIS — G8929 Other chronic pain: Secondary | ICD-10-CM | POA: Insufficient documentation

## 2018-08-09 DIAGNOSIS — S83242A Other tear of medial meniscus, current injury, left knee, initial encounter: Secondary | ICD-10-CM | POA: Insufficient documentation

## 2018-08-09 DIAGNOSIS — M25562 Pain in left knee: Secondary | ICD-10-CM | POA: Diagnosis present

## 2018-08-09 DIAGNOSIS — M948X6 Other specified disorders of cartilage, lower leg: Secondary | ICD-10-CM | POA: Insufficient documentation

## 2018-08-15 ENCOUNTER — Telehealth: Payer: Self-pay | Admitting: *Deleted

## 2018-08-23 ENCOUNTER — Encounter: Payer: Medicaid Other | Admitting: Student in an Organized Health Care Education/Training Program

## 2018-08-25 ENCOUNTER — Encounter: Payer: Self-pay | Admitting: Nurse Practitioner

## 2018-08-25 ENCOUNTER — Ambulatory Visit
Payer: Medicaid Other | Attending: Student in an Organized Health Care Education/Training Program | Admitting: Nurse Practitioner

## 2018-08-25 ENCOUNTER — Telehealth: Payer: Self-pay | Admitting: *Deleted

## 2018-08-25 VITALS — BP 136/88 | HR 105 | Temp 98.5°F | Resp 16 | Ht 70.0 in | Wt 270.0 lb

## 2018-08-25 DIAGNOSIS — Z888 Allergy status to other drugs, medicaments and biological substances status: Secondary | ICD-10-CM | POA: Diagnosis not present

## 2018-08-25 DIAGNOSIS — G894 Chronic pain syndrome: Secondary | ICD-10-CM | POA: Diagnosis not present

## 2018-08-25 DIAGNOSIS — M1711 Unilateral primary osteoarthritis, right knee: Secondary | ICD-10-CM | POA: Insufficient documentation

## 2018-08-25 DIAGNOSIS — Z6838 Body mass index (BMI) 38.0-38.9, adult: Secondary | ICD-10-CM | POA: Insufficient documentation

## 2018-08-25 DIAGNOSIS — Z79891 Long term (current) use of opiate analgesic: Secondary | ICD-10-CM | POA: Diagnosis not present

## 2018-08-25 DIAGNOSIS — M5416 Radiculopathy, lumbar region: Secondary | ICD-10-CM | POA: Insufficient documentation

## 2018-08-25 DIAGNOSIS — M25561 Pain in right knee: Secondary | ICD-10-CM | POA: Diagnosis not present

## 2018-08-25 DIAGNOSIS — M25562 Pain in left knee: Secondary | ICD-10-CM | POA: Diagnosis not present

## 2018-08-25 DIAGNOSIS — Z5181 Encounter for therapeutic drug level monitoring: Secondary | ICD-10-CM | POA: Insufficient documentation

## 2018-08-25 DIAGNOSIS — M7918 Myalgia, other site: Secondary | ICD-10-CM

## 2018-08-25 DIAGNOSIS — Z79899 Other long term (current) drug therapy: Secondary | ICD-10-CM | POA: Diagnosis not present

## 2018-08-25 DIAGNOSIS — G8929 Other chronic pain: Secondary | ICD-10-CM

## 2018-08-25 DIAGNOSIS — I10 Essential (primary) hypertension: Secondary | ICD-10-CM | POA: Insufficient documentation

## 2018-08-25 DIAGNOSIS — Z885 Allergy status to narcotic agent status: Secondary | ICD-10-CM | POA: Diagnosis not present

## 2018-08-25 MED ORDER — BACLOFEN 10 MG PO TABS: 10.0000 mg | ORAL_TABLET | Freq: Three times a day (TID) | ORAL | 1 refills | Status: DC

## 2018-08-25 MED ORDER — DICLOFENAC SODIUM 75 MG PO TBEC: 75.0000 mg | DELAYED_RELEASE_TABLET | Freq: Two times a day (BID) | ORAL | 1 refills | Status: DC

## 2018-08-25 MED ORDER — GABAPENTIN 300 MG PO CAPS: ORAL_CAPSULE | ORAL | 1 refills | Status: DC

## 2018-08-25 MED ORDER — OXYCODONE HCL 5 MG PO TABS: 5.0000 mg | ORAL_TABLET | Freq: Three times a day (TID) | ORAL | 0 refills | Status: AC | PRN

## 2018-08-25 NOTE — Telephone Encounter (Signed)
Call to [facility] pharmacy for question re; oxycodone 10 mg qty 51 instead of qty 90.  Pharmacist states that the qty of 51 was a 34 day supply which is all that Medicaid would cover.  Will let provider know.

## 2018-08-25 NOTE — Progress Notes (Signed)
Nursing Pain Medication Assessment:  Safety precautions to be maintained throughout the outpatient stay will include: orient to surroundings, keep bed in low position, maintain call bell within reach at all times, provide assistance with transfer out of bed and ambulation.  Medication Inspection Compliance: Pill count conducted under aseptic conditions, in front of the patient. Neither the pills nor the bottle was removed from the patient's sight at any time. Once count was completed pills were immediately returned to the patient in their original bottle.  Medication: Oxycodone IR Pill/Patch Count: 0 of 51 pills remain Pill/Patch Appearance: Markings consistent with prescribed medication Bottle Appearance: Standard pharmacy container. Clearly labeled. Filled Date: 08 / 22 / 2019 Last Medication intake:  Ran out of medicine more than 48 hours ago

## 2018-08-25 NOTE — Patient Instructions (Addendum)
____________________________________________________________________________________________  Medication Rules  Applies to: All patients receiving prescriptions (written or electronic).  Pharmacy of record: Pharmacy where electronic prescriptions will be sent. If written prescriptions are taken to a different pharmacy, please inform the nursing staff. The pharmacy listed in the electronic medical record should be the one where you would like electronic prescriptions to be sent.  Prescription refills: Only during scheduled appointments. Applies to both, written and electronic prescriptions.  NOTE: The following applies primarily to controlled substances (Opioid* Pain Medications).   Patient's responsibilities: 1. Pain Pills: Bring all pain pills to every appointment (except for procedure appointments). 2. Pill Bottles: Bring pills in original pharmacy bottle. Always bring newest bottle. Bring bottle, even if empty. 3. Medication refills: You are responsible for knowing and keeping track of what medications you need refilled. The day before your appointment, write a list of all prescriptions that need to be refilled. Bring that list to your appointment and give it to the admitting nurse. Prescriptions will be written only during appointments. If you forget a medication, it will not be "Called in", "Faxed", or "electronically sent". You will need to get another appointment to get these prescribed. 4. Prescription Accuracy: You are responsible for carefully inspecting your prescriptions before leaving our office. Have the discharge nurse carefully go over each prescription with you, before taking them home. Make sure that your name is accurately spelled, that your address is correct. Check the name and dose of your medication to make sure it is accurate. Check the number of pills, and the written instructions to make sure they are clear and accurate. Make sure that you are given enough medication to last  until your next medication refill appointment. 5. Taking Medication: Take medication as prescribed. Never take more pills than instructed. Never take medication more frequently than prescribed. Taking less pills or less frequently is permitted and encouraged, when it comes to controlled substances (written prescriptions).  6. Inform other Doctors: Always inform, all of your healthcare providers, of all the medications you take. 7. Pain Medication from other Providers: You are not allowed to accept any additional pain medication from any other Doctor or Healthcare provider. There are two exceptions to this rule. (see below) In the event that you require additional pain medication, you are responsible for notifying us, as stated below. 8. Medication Agreement: You are responsible for carefully reading and following our Medication Agreement. This must be signed before receiving any prescriptions from our practice. Safely store a copy of your signed Agreement. Violations to the Agreement will result in no further prescriptions. (Additional copies of our Medication Agreement are available upon request.) 9. Laws, Rules, & Regulations: All patients are expected to follow all Federal and State Laws, Statutes, Rules, & Regulations. Ignorance of the Laws does not constitute a valid excuse. The use of any illegal substances is prohibited. 10. Adopted CDC guidelines & recommendations: Target dosing levels will be at or below 60 MME/day. Use of benzodiazepines** is not recommended.  Exceptions: There are only two exceptions to the rule of not receiving pain medications from other Healthcare Providers. 1. Exception #1 (Emergencies): In the event of an emergency (i.e.: accident requiring emergency care), you are allowed to receive additional pain medication. However, you are responsible for: As soon as you are able, call our office [phone], at any time of the day or night, and leave a message stating your name, the  date and nature of the emergency, and the name and dose of the medication   prescribed. In the event that your call is answered by a member of our staff, make sure to document and save the date, time, and the name of the person that took your information.  2. Exception #2 (Planned Surgery): In the event that you are scheduled by another doctor or dentist to have any type of surgery or procedure, you are allowed (for a period no longer than 30 days), to receive additional pain medication, for the acute post-op pain. However, in this case, you are responsible for picking up a copy of our "Post-op Pain Management for Surgeons" handout, and giving it to your surgeon or dentist. This document is available at our office, and does not require an appointment to obtain it. Simply go to our office during business hours (Monday-Thursday from 8:00 AM to 4:00 PM) (Friday 8:00 AM to 12:00 Noon) or if you have a scheduled appointment with [place], prior to your surgery, and ask for it by name. In addition, you will need to provide [place] with your name, name of your surgeon, type of surgery, and date of procedure or surgery.  *Opioid medications include: morphine, codeine, oxycodone, oxymorphone, hydrocodone, hydromorphone, meperidine, tramadol, tapentadol, buprenorphine, fentanyl, methadone. **Benzodiazepine medications include: diazepam (Valium), alprazolam (Xanax), clonazepam (Klonopine), lorazepam (Ativan), clorazepate (Tranxene), chlordiazepoxide (Librium), estazolam (Prosom), oxazepam (Serax), temazepam (Restoril), triazolam (Halcion) (Last updated: 01/27/2018) ____________________________________________________________________________________________   BMI Assessment: Estimated body mass index is 38.74 kg/m as calculated from the following:   Height as of this encounter: 5\' 10"  (1.778 m).   Weight as of this encounter: 270 lb (122.5 kg).  BMI interpretation table: BMI level Category Range association with higher  incidence of chronic pain  <18 kg/m2 Underweight   18.5-24.9 kg/m2 Ideal body weight   25-29.9 kg/m2 Overweight Increased incidence by 20%  30-34.9 kg/m2 Obese (Class I) Increased incidence by 68%  35-39.9 kg/m2 Severe obesity (Class II) Increased incidence by 136%  >40 kg/m2 Extreme obesity (Class III) Increased incidence by 254%   Patient's current BMI Ideal Body weight  Body mass index is 38.74 kg/m. Ideal body weight: 68.5 kg (151 lb 0.2 oz) Adjusted ideal body weight: 90.1 kg (198 lb 9.7 oz)   BMI Readings from Last 4 Encounters:  08/25/18 38.74 kg/m  07/21/18 38.74 kg/m  05/16/18 40.18 kg/m  04/19/18 31.57 kg/m   Wt Readings from Last 4 Encounters:  08/25/18 270 lb (122.5 kg)  07/21/18 270 lb (122.5 kg)  05/16/18 280 lb (127 kg)  03/09/18 220 lb (99.8 kg)   Baclofen, diclofenac and gabapentin escribed to pharmacy Oxycodone 5 mg , 1 tablet three times per day to begin filling on 08/25/18 escribed to pharmacy [facility] today.

## 2018-08-25 NOTE — Progress Notes (Addendum)
Patient's Name: Terri Jennings  MRN: [id number]  Referring Provider: [facility] [place]*  DOB: [birth year]  PCP: [facility], [place]: 08/25/2018  Note by: Vevelyn Francois NP  Service setting: Ambulatory outpatient  Specialty: Interventional Pain Management  Location: [facility] (AMB) Pain Management Facility    Patient type: Established    Primary Reason(s) for Visit: Encounter for prescription drug management. (Level of risk: moderate)  CC: Knee Pain (bilateral, s/p meniscus repair on right.  torn meniscus on left )  HPI  Ms. Barnfield is a 62 y.o. year old, female patient, who comes today for a medication management evaluation. She has Chronic radicular low back pain; Chronic knee pain; Primary osteoarthritis of right knee; Morbid obesity ([place]); Chronic pain syndrome; Chronic pain of both knees (L>R); Long term current use of opiate analgesic; and Chronic musculoskeletal pain on their problem list. Her primarily concern today is the Knee Pain (bilateral, s/p meniscus repair on right.  torn meniscus on left )  Pain Assessment: Location: Left, Right(right meniscus has been repaired, left is torn) Knee Radiating: down the leg on the right side and to the toes on the left  Onset: More than a month ago Duration: Chronic pain Quality: Tingling, Burning, Numbness, Sharp, Discomfort, Throbbing(new injury throbbing at night on the left knee. ) Severity: 8 /10 (subjective, self-reported pain score)  Note: Reported level is compatible with observation. Clinically the patient looks like a 2/10 A 2/10 is viewed as "Mild to Moderate" and described as noticeable and distracting. Impossible to hide from other people. More frequent flare-ups. Still possible to adapt and function close to normal. It can be very annoying and may have occasional stronger flare-ups. With discipline, patients may get used to it and adapt. Information on the proper use of the pain scale provided to the patient  today. When using our objective Pain Scale, levels between 6 and 10/10 are said to belong in an emergency room, as it progressively worsens from a 6/10, described as severely limiting, requiring emergency care not usually available at an outpatient pain management facility. At a 6/10 level, communication becomes difficult and requires great effort. Assistance to reach the emergency department may be required. Facial flushing and profuse sweating along with potentially dangerous increases in heart rate and blood pressure will be evident. Effect on ADL: affecting her gait, having to hold on to walls or furniture when walking.  Timing: Constant Modifying factors: pain medication, ice BP: 136/88  HR: (!) 105  Ms. Neubert was last scheduled for an appointment on Visit date not found for medication management. During today's appointment we reviewed Ms. Hoban's chronic pain status, as well as her outpatient medication regimen. She is in today for an early appointment secondary to not receiving her oxycodone prescription as written.  She has previously been on oxycodone 10 mg half a tablet 3 times daily which would offer her 4-monthsupply.  However insurance would only cover quantity of 51 tablets which equates to 34 days.  She is now out of medication.  She admits that her knee pain is worse however she is not interested in surgery. She admits that the previous interventional therapy was effective for approximately 7 days.  She admits that she did receive a steroid injection to her knee approximately 2 weeks ago with orthopedist which was effective for 3 days.  The patient  reports that she does not use drugs. Her body mass index is 38.74 kg/m.  Further details on both, my assessment(s),  as well as the proposed treatment plan, please see below.  Controlled Substance Pharmacotherapy Assessment REMS (Risk Evaluation and Mitigation Strategy)  Analgesic: Oxycodone 5 mg 3 times daily MME/day: 22.5 mg/day.   Janett Billow, RN  08/25/2018  9:53 AM  Signed Nursing Pain Medication Assessment:  Safety precautions to be maintained throughout the outpatient stay will include: orient to surroundings, keep bed in low position, maintain call bell within reach at all times, provide assistance with transfer out of bed and ambulation.  Medication Inspection Compliance: Pill count conducted under aseptic conditions, in front of the patient. Neither the pills nor the bottle was removed from the patient's sight at any time. Once count was completed pills were immediately returned to the patient in their original bottle.  Medication: Oxycodone IR Pill/Patch Count: 0 of 51 pills remain Pill/Patch Appearance: Markings consistent with prescribed medication Bottle Appearance: Standard pharmacy container. Clearly labeled. Filled Date: 08 / 22 / 2019 Last Medication intake:  Ran out of medicine more than 48 hours ago   Pharmacokinetics: Liberation and absorption (onset of action): WNL Distribution (time to peak effect): WNL Metabolism and excretion (duration of action): WNL         Pharmacodynamics: Desired effects: Analgesia: Ms. Oehler reports >50% benefit. Functional ability: Patient reports that medication allows her to accomplish basic ADLs Clinically meaningful improvement in function (CMIF): Sustained CMIF goals met Perceived effectiveness: Described as ineffective and would like to make some changes She admits that she would like to go back to the 10 mg Undesirable effects: Side-effects or Adverse reactions: None reported Monitoring: [place] PMP: Online review of the past 26-monthperiod conducted. Compliant with practice rules and regulations Last UDS on record: Summary  Date Value Ref Range Status  12/28/2017 FINAL  Final    Comment:    ==================================================================== TOXASSURE COMP DRUG  ANALYSIS,UR ==================================================================== Test                             Result       Flag       Units Drug Present and Declared for Prescription Verification   Gabapentin                     PRESENT      EXPECTED   Naproxen                       PRESENT      EXPECTED Drug Present not Declared for Prescription Verification   Phentermine                    PRESENT      UNEXPECTED   Doxylamine                     PRESENT      UNEXPECTED Drug Absent but Declared for Prescription Verification   Tizanidine                     Not Detected UNEXPECTED    Tizanidine, as indicated in the declared medication list, is not    always detected even when used as directed. ==================================================================== Test                      Result    Flag   Units      Ref Range   Creatinine  168              mg/dL      >=20 ==================================================================== Declared Medications:  The flagging and interpretation on this report are based on the  following declared medications.  Unexpected results may arise from  inaccuracies in the declared medications.  **Note: The testing scope of this panel includes these medications:  Gabapentin  Naproxen  **Note: The testing scope of this panel does not include small to  moderate amounts of these reported medications:  Tizanidine  **Note: The testing scope of this panel does not include following  reported medications:  Amlodipine  Atorvastatin  Lisinopril ==================================================================== For clinical consultation, please call [phone]. ====================================================================    UDS interpretation: Compliant          Medication Assessment Form: Reviewed. Patient indicates being compliant with therapy Treatment compliance: Compliant Risk Assessment Profile: Aberrant  behavior: See prior evaluations. None observed or detected today Comorbid factors increasing risk of overdose: See prior notes. No additional risks detected today Opioid risk tool (ORT) (Total Score):   Personal History of Substance Abuse (SUD-Substance use disorder):  Alcohol:    Illegal Drugs:    Rx Drugs:    ORT Risk Level calculation:   Risk of substance use disorder (SUD): Low  ORT Scoring interpretation table:  Score <3 = Low Risk for SUD  Score between 4-7 = Moderate Risk for SUD  Score >8 = High Risk for Opioid Abuse   Risk Mitigation Strategies:  Patient Counseling: Covered Patient-Prescriber Agreement (PPA): Present and active  Notification to other healthcare providers: Done  Pharmacologic Plan: No change in therapy, at this time.             Laboratory Chemistry  Inflammation Markers (CRP: Acute Phase) (ESR: Chronic Phase) No results found for: CRP, ESRSEDRATE, LATICACIDVEN                       Rheumatology Markers No results found for: RF, ANA, LABURIC, URICUR, LYMEIGGIGMAB, LYMEABIGMQN, HLAB27                      Renal Function Markers No results found for: BUN, CREATININE, BCR, GFRAA, GFRNONAA                           Hepatic Function Markers No results found for: AST, ALT, ALBUMIN, ALKPHOS, HCVAB, AMYLASE, LIPASE, AMMONIA                      Electrolytes No results found for: NA, K, CL, CALCIUM, MG, PHOS                      Neuropathy Markers No results found for: VITAMINB12, FOLATE, HGBA1C, HIV                      CNS Tests No results found for: COLORCSF, APPEARCSF, RBCCOUNTCSF, WBCCSF, POLYSCSF, LYMPHSCSF, EOSCSF, PROTEINCSF, GLUCCSF, JCVIRUS, CSFOLI, IGGCSF                      Bone Pathology Markers No results found for: VD25OH, [id number], OH6067PC3, EK3524EL8, 25OHVITD1, 25OHVITD2, 25OHVITD3, TESTOFREE, TESTOSTERONE                       Coagulation Parameters No results found for: INR, LABPROT, APTT, PLT, DDIMER  Cardiovascular Markers No results found for: BNP, CKTOTAL, CKMB, TROPONINI, HGB, HCT                       CA Markers No results found for: CEA, CA125, LABCA2                      Note: Lab results reviewed.  Recent Diagnostic Imaging Results  MR KNEE LEFT WO CONTRAST CLINICAL DATA:  Left knee pain since May 2019.  EXAM: MRI OF THE LEFT KNEE WITHOUT CONTRAST  TECHNIQUE: Multiplanar, multisequence MR imaging of the knee was performed. No intravenous contrast was administered.  COMPARISON:  None.  FINDINGS: MENISCI  Medial meniscus: Radial tear of the posterior horn of the medial meniscus with peripheral meniscal extrusion. Small undersurface tear of the body of the medial meniscus.  Lateral meniscus:  Intact.  LIGAMENTS  Cruciates:  Intact ACL and PCL.  Collaterals: Medial collateral ligament is intact. Lateral collateral ligament complex is intact.  CARTILAGE  Patellofemoral: Partial-thickness cartilage loss of the patellar apex with minimal subchondral reactive marrow changes. High-grade partial-thickness cartilage loss with areas of full-thickness cartilage loss of the trochlear groove with subchondral reactive marrow changes.  Medial: High-grade partial-thickness cartilage loss with areas of full-thickness cartilage loss of the medial femoral condyle and medial tibial plateau. Osteochondral lesion involving the weight-bearing surface of the medial femoral condyle measuring 10 x 23 mm with cystic changes and surrounding marrow edema at the OCL-bone interface.  Lateral:  No chondral defect.  Joint: Small joint effusion. Normal Hoffa's fat. No plical thickening.  Popliteal Fossa:  Tiny Baker's cyst.  Intact popliteus tendon.  Extensor Mechanism: Intact quadriceps tendon. Intact patellar tendon. Intact medial patellar retinaculum. Intact lateral patellar retinaculum. Intact MPFL.  Bones:  No acute osseous abnormality.  No aggressive osseous  lesion.  Other: No fluid collection or hematoma.  IMPRESSION: 1. Radial tear of the posterior horn of the medial meniscus with peripheral meniscal extrusion. Small undersurface tear of the body of the medial meniscus. 2. Partial-thickness cartilage loss of the patellar apex with minimal subchondral reactive marrow changes. High-grade partial-thickness cartilage loss with areas of full-thickness cartilage loss of the trochlear groove with subchondral reactive marrow changes. 3. High-grade partial-thickness cartilage loss with areas of full-thickness cartilage loss of the medial femoral condyle and medial tibial plateau. Osteochondral lesion involving the weight-bearing surface of the medial femoral condyle measuring 10 x 23 mm with cystic changes and surrounding marrow edema at the OCL-bone interface.  Electronically Signed   By: Kathreen Devoid   On: 08/09/2018 11:58  Complexity Note: Imaging results reviewed. Results shared with Ms. Buikema, using Layman's terms.                         Meds   Current Outpatient Medications:  .  AMLODIPINE BESYLATE PO, Take 10 mg by mouth. , Disp: , Rfl:  .  atorvastatin (LIPITOR) 10 MG tablet, Take 10 mg by mouth daily., Disp: , Rfl:  .  atropine 1 % ophthalmic solution, Place 1 drop into the right eye daily., Disp: , Rfl: 6 .  baclofen (LIORESAL) 10 MG tablet, Take 1 tablet (10 mg total) by mouth 3 (three) times daily., Disp: 30 each, Rfl: 1 .  diclofenac (VOLTAREN) 75 MG EC tablet, Take 1 tablet (75 mg total) by mouth 2 (two) times daily., Disp: 60 tablet, Rfl: 1 .  gabapentin (NEURONTIN) 300 MG capsule, 300 mg  qday, 600 mg qhs, Disp: 90 capsule, Rfl: 1 .  lisinopril (PRINIVIL,ZESTRIL) 20 MG tablet, Take 25 mg by mouth daily. , Disp: , Rfl:  .  prednisoLONE acetate (PRED FORTE) 1 % ophthalmic suspension, Place 1 drop into the right eye 3 (three) times daily., Disp: , Rfl: 3 .  oxyCODONE (OXY IR/ROXICODONE) 5 MG immediate release tablet, Take 1  tablet (5 mg total) by mouth every 8 (eight) hours as needed for severe pain., Disp: 90 tablet, Rfl: 0 .  [START ON 09/24/2018] oxyCODONE (OXY IR/ROXICODONE) 5 MG immediate release tablet, Take 1 tablet (5 mg total) by mouth every 8 (eight) hours as needed for severe pain., Disp: 90 tablet, Rfl: 0  ROS  Constitutional: Denies any fever or chills Gastrointestinal: No reported hemesis, hematochezia, vomiting, or acute GI distress Musculoskeletal: Denies any acute onset joint swelling, redness, loss of ROM, or weakness Neurological: No reported episodes of acute onset apraxia, aphasia, dysarthria, agnosia, amnesia, paralysis, loss of coordination, or loss of consciousness  Allergies  Ms. Oldaker is allergic to morphine and related; oxycontin [oxycodone hcl]; tramadol; and hydrocodone.  [place]  Drug: Ms. Romano  reports that she does not use drugs. Alcohol:  reports that she does not drink alcohol. Tobacco:  reports that she has never smoked. She has never used smokeless tobacco. Medical:  has a past medical history of Arthritis, Dysrhythmia, Enlarged heart, and Hypertension. Surgical: Ms. Sculley  has a past surgical history that includes Knee surgery (Right, 2009) and Cataract extraction w/PHACO (Right, 03/18/2017). Family: family history includes Cancer in her maternal grandmother; Stroke in her father.  Constitutional Exam  General appearance: Well nourished, well developed, and well hydrated. In no apparent acute distress Vitals:   08/25/18 0834  BP: 136/88  Pulse: (!) 105  Resp: 16  Temp: 98.5 F (36.9 C)  TempSrc: Oral  SpO2: 98%  Weight: 270 lb (122.5 kg)  Height: _0  (1.778 m)  Psych/Mental status: Alert, oriented x 3 (person, place, & time)       Eyes: PERLA Respiratory: No evidence of acute respiratory distress  Lumbar Spine Area Exam  Skin & Axial Inspection: No masses, redness, or swelling Alignment: Symmetrical Functional ROM: Unrestricted ROM       Stability: No  instability detected Muscle Tone/Strength: Functionally intact. No obvious neuro-muscular anomalies detected. Sensory (Neurological): Unimpaired Palpation: No palpable anomalies       Provocative Tests: Hyperextension/rotation test: deferred today       Lumbar quadrant test (Kemp's test): deferred today       Lateral bending test: deferred today       Patrick's Maneuver: deferred today                   FABER test: deferred today                   S-I anterior distraction/compression test: deferred today         S-I lateral compression test: deferred today         S-I Thigh-thrust test: deferred today         S-I Gaenslen's test: deferred today          Gait & Posture Assessment  Ambulation: Patient ambulates using a cane Gait: Limited. Using assistive device to ambulate Posture: WNL   Lower Extremity Exam    Side: Right lower extremity  Side: Left lower extremity  Stability: No instability observed          Stability: No instability  observed          Skin & Extremity Inspection: Skin color, temperature, and hair growth are WNL. No peripheral edema or cyanosis. No masses, redness, swelling, asymmetry, or associated skin lesions. No contractures.  Skin & Extremity Inspection: Brace worn  Functional ROM: Unrestricted ROM                  Functional ROM: Unrestricted ROM                  Muscle Tone/Strength: Functionally intact. No obvious neuro-muscular anomalies detected.  Muscle Tone/Strength: Functionally intact. No obvious neuro-muscular anomalies detected.  Sensory (Neurological): Unimpaired  Sensory (Neurological): Unimpaired  Palpation: No palpable anomalies  Palpation: No palpable anomalies   Assessment  Primary Diagnosis & Pertinent Problem List: The primary encounter diagnosis was Chronic pain of both knees (L>R). Diagnoses of Chronic radicular low back pain, Chronic pain syndrome, Long term current use of opiate analgesic, and Chronic musculoskeletal pain were also  pertinent to this visit.  Status Diagnosis  Worsening Persistent Persistent 1. Chronic pain of both knees (L>R)   2. Chronic radicular low back pain   3. Chronic pain syndrome   4. Long term current use of opiate analgesic   5. Chronic musculoskeletal pain     Problems updated and reviewed during this visit: Problem  Chronic pain of both knees (L>R)  Long Term Current Use of Opiate Analgesic  Chronic Musculoskeletal Pain   Plan of Care  Pharmacotherapy (Medications Ordered): Meds ordered this encounter  Medications  . baclofen (LIORESAL) 10 MG tablet    Sig: Take 1 tablet (10 mg total) by mouth 3 (three) times daily.    Dispense:  30 each    Refill:  1    Order Specific Question:   Supervising Provider    Answer:   Milinda Pointer [phone]  . diclofenac (VOLTAREN) 75 MG EC tablet    Sig: Take 1 tablet (75 mg total) by mouth 2 (two) times daily.    Dispense:  60 tablet    Refill:  1    Order Specific Question:   Supervising Provider    Answer:   Milinda Pointer [phone]  . gabapentin (NEURONTIN) 300 MG capsule    Sig: 300 mg qday, 600 mg qhs    Dispense:  90 capsule    Refill:  1    Order Specific Question:   Supervising Provider    AnswerMilinda Pointer [phone]  . oxyCODONE (OXY IR/ROXICODONE) 5 MG immediate release tablet    Sig: Take 1 tablet (5 mg total) by mouth every 8 (eight) hours as needed for severe pain.    Dispense:  90 tablet    Refill:  0    Do not place this medication, or any other prescription from our practice, on "Automatic Refill". Patient may have prescription filled one day early if pharmacy is closed on scheduled refill date.    Order Specific Question:   Supervising Provider    Answer:   Milinda Pointer [phone]  . oxyCODONE (OXY IR/ROXICODONE) 5 MG immediate release tablet    Sig: Take 1 tablet (5 mg total) by mouth every 8 (eight) hours as needed for severe pain.    Dispense:  90 tablet    Refill:  0    Do not place this  medication, or any other prescription from our practice, on "Automatic Refill". Patient may have prescription filled one day early if pharmacy is closed on scheduled refill date.  Order Specific Question:   Supervising Provider    Answer:   Milinda Pointer [phone]   New Prescriptions   OXYCODONE (OXY IR/ROXICODONE) 5 MG IMMEDIATE RELEASE TABLET    Take 1 tablet (5 mg total) by mouth every 8 (eight) hours as needed for severe pain.   OXYCODONE (OXY IR/ROXICODONE) 5 MG IMMEDIATE RELEASE TABLET    Take 1 tablet (5 mg total) by mouth every 8 (eight) hours as needed for severe pain.   Medications administered today: Genella Rife had no medications administered during this visit. Lab-work, procedure(s), and/or referral(s): Orders Placed This Encounter  Procedures  . ToxASSURE Select 13 (MW), Urine   Imaging and/or referral(s): None  Interventional therapies: Planned, scheduled, and/or pending:   Not at this time.    Provider-requested follow-up: Return in about 2 months (around 10/25/2018) for MedMgmt.  Future Appointments  Date Time Provider [place]  10/24/2018  9:00 AM Vevelyn Francois, NP [facility] None   Primary Care Physician: [facility] [place] Location: [facility] Outpatient Pain Management Facility Note by: Vevelyn Francois NP Date: 08/25/2018; Time: 9:53 AM  Pain Score Disclaimer: We use the NRS-11 scale. This is a self-reported, subjective measurement of pain severity with only modest accuracy. It is used primarily to identify changes within a particular patient. It must be understood that outpatient pain scales are significantly less accurate that those used for research, where they can be applied under ideal controlled circumstances with minimal exposure to variables. In reality, the score is likely to be a combination of pain intensity and pain affect, where pain affect describes the degree of emotional arousal or changes in action readiness caused  by the sensory experience of pain. Factors such as social and work situation, setting, emotional state, anxiety levels, expectation, and prior pain experience may influence pain perception and show large inter-individual differences that may also be affected by time variables.  Patient instructions provided during this appointment: Patient Instructions   ____________________________________________________________________________________________  Medication Rules  Applies to: All patients receiving prescriptions (written or electronic).  Pharmacy of record: Pharmacy where electronic prescriptions will be sent. If written prescriptions are taken to a different pharmacy, please inform the nursing staff. The pharmacy listed in the electronic medical record should be the one where you would like electronic prescriptions to be sent.  Prescription refills: Only during scheduled appointments. Applies to both, written and electronic prescriptions.  NOTE: The following applies primarily to controlled substances (Opioid* Pain Medications).   Patient's responsibilities: 1. Pain Pills: Bring all pain pills to every appointment (except for procedure appointments). 2. Pill Bottles: Bring pills in original pharmacy bottle. Always bring newest bottle. Bring bottle, even if empty. 3. Medication refills: You are responsible for knowing and keeping track of what medications you need refilled. The day before your appointment, write a list of all prescriptions that need to be refilled. Bring that list to your appointment and give it to the admitting nurse. Prescriptions will be written only during appointments. If you forget a medication, it will not be "Called in", "Faxed", or "electronically sent". You will need to get another appointment to get these prescribed. 4. Prescription Accuracy: You are responsible for carefully inspecting your prescriptions before leaving our office. Have the discharge nurse carefully  go over each prescription with you, before taking them home. Make sure that your name is accurately spelled, that your address is correct. Check the name and dose of your medication to make sure it is accurate. Check the number  of pills, and the written instructions to make sure they are clear and accurate. Make sure that you are given enough medication to last until your next medication refill appointment. 5. Taking Medication: Take medication as prescribed. Never take more pills than instructed. Never take medication more frequently than prescribed. Taking less pills or less frequently is permitted and encouraged, when it comes to controlled substances (written prescriptions).  6. Inform other Doctors: Always inform, all of your healthcare providers, of all the medications you take. 7. Pain Medication from other Providers: You are not allowed to accept any additional pain medication from any other Doctor or Healthcare provider. There are two exceptions to this rule. (see below) In the event that you require additional pain medication, you are responsible for notifying us, as stated below. 8. Medication Agreement: You are responsible for carefully reading and following our Medication Agreement. This must be signed before receiving any prescriptions from our practice. Safely store a copy of your signed Agreement. Violations to the Agreement will result in no further prescriptions. (Additional copies of our Medication Agreement are available upon request.) 9. Laws, Rules, & Regulations: All patients are expected to follow all Federal and [employer], [employer], Rules, [employer]. Ignorance of the Laws does not constitute a valid excuse. The use of any illegal substances is prohibited. 10. Adopted CDC guidelines & recommendations: Target dosing levels will be at or below 60 MME/day. Use of benzodiazepines** is not recommended.  Exceptions: There are only two exceptions to the rule of not receiving pain  medications from other Healthcare Providers. 1. Exception #1 (Emergencies): In the event of an emergency (i.e.: accident requiring emergency care), you are allowed to receive additional pain medication. However, you are responsible for: As soon as you are able, call our office [phone], at any time of the day or night, and leave a message stating your name, the date and nature of the emergency, and the name and dose of the medication prescribed. In the event that your call is answered by a member of our staff, make sure to document and save the date, time, and the name of the person that took your information.  2. Exception #2 (Planned Surgery): In the event that you are scheduled by another doctor or dentist to have any type of surgery or procedure, you are allowed (for a period no longer than 30 days), to receive additional pain medication, for the acute post-op pain. However, in this case, you are responsible for picking up a copy of our "Post-op Pain Management for Surgeons" handout, and giving it to your surgeon or dentist. This document is available at our office, and does not require an appointment to obtain it. Simply go to our office during business hours (Monday-Thursday from 8:00 AM to 4:00 PM) (Friday 8:00 AM to 12:00 Noon) or if you have a scheduled appointment with [place], prior to your surgery, and ask for it by name. In addition, you will need to provide [place] with your name, name of your surgeon, type of surgery, and date of procedure or surgery.  *Opioid medications include: morphine, codeine, oxycodone, oxymorphone, hydrocodone, hydromorphone, meperidine, tramadol, tapentadol, buprenorphine, fentanyl, methadone. **Benzodiazepine medications include: diazepam (Valium), alprazolam (Xanax), clonazepam (Klonopine), lorazepam (Ativan), clorazepate (Tranxene), chlordiazepoxide (Librium), estazolam (Prosom), oxazepam (Serax), temazepam (Restoril), triazolam (Halcion) (Last updated:  01/27/2018) ____________________________________________________________________________________________   BMI Assessment: Estimated body mass index is 38.74 kg/m as calculated from the following:   Height as of this encounter: _0  (1.778 m).  Weight as of this encounter: 270 lb (122.5 kg).  BMI interpretation table: BMI level Category Range association with higher incidence of chronic pain  <18 kg/m2 Underweight   18.5-24.9 kg/m2 Ideal body weight   25-29.9 kg/m2 Overweight Increased incidence by 20%  30-34.9 kg/m2 Obese (Class I) Increased incidence by 68%  35-39.9 kg/m2 Severe obesity (Class II) Increased incidence by 136%  >40 kg/m2 Extreme obesity (Class III) Increased incidence by 254%   Patient's current BMI Ideal Body weight  Body mass index is 38.74 kg/m. Ideal body weight: 68.5 kg (151 lb 0.2 oz) Adjusted ideal body weight: 90.1 kg (198 lb 9.7 oz)   BMI Readings from Last 4 Encounters:  08/25/18 38.74 kg/m  07/21/18 38.74 kg/m  05/16/18 40.18 kg/m  04/19/18 31.57 kg/m   Wt Readings from Last 4 Encounters:  08/25/18 270 lb (122.5 kg)  07/21/18 270 lb (122.5 kg)  05/16/18 280 lb (127 kg)  03/09/18 220 lb (99.8 kg)   Baclofen, diclofenac and gabapentin escribed to pharmacy Oxycodone 5 mg , 1 tablet three times per day to begin filling on 08/25/18 escribed to pharmacy [facility] today.

## 2018-08-30 ENCOUNTER — Telehealth: Payer: Self-pay

## 2018-08-30 NOTE — Telephone Encounter (Signed)
Notified patient that it had been sent and we have no heard back yet.

## 2018-08-30 NOTE — Telephone Encounter (Signed)
She wants to know if her prior auth for her medication has gone through. She hasn't heard anything.

## 2018-08-31 ENCOUNTER — Telehealth: Payer: Self-pay | Admitting: *Deleted

## 2018-08-31 LAB — TOXASSURE SELECT 13 (MW), URINE

## 2018-09-12 ENCOUNTER — Encounter: Payer: Medicaid Other | Admitting: Nurse Practitioner

## 2018-10-24 ENCOUNTER — Ambulatory Visit: Payer: Medicaid Other | Attending: Nurse Practitioner | Admitting: Nurse Practitioner

## 2018-10-24 ENCOUNTER — Encounter: Payer: Self-pay | Admitting: Nurse Practitioner

## 2018-10-24 VITALS — BP 145/83 | HR 85 | Temp 98.0°F | Ht 70.0 in | Wt 285.0 lb

## 2018-10-24 DIAGNOSIS — I10 Essential (primary) hypertension: Secondary | ICD-10-CM | POA: Insufficient documentation

## 2018-10-24 DIAGNOSIS — M5416 Radiculopathy, lumbar region: Secondary | ICD-10-CM | POA: Diagnosis not present

## 2018-10-24 DIAGNOSIS — M1990 Unspecified osteoarthritis, unspecified site: Secondary | ICD-10-CM | POA: Diagnosis not present

## 2018-10-24 DIAGNOSIS — G894 Chronic pain syndrome: Secondary | ICD-10-CM | POA: Diagnosis not present

## 2018-10-24 DIAGNOSIS — Z79899 Other long term (current) drug therapy: Secondary | ICD-10-CM | POA: Diagnosis not present

## 2018-10-24 DIAGNOSIS — M7918 Myalgia, other site: Secondary | ICD-10-CM

## 2018-10-24 DIAGNOSIS — S83242A Other tear of medial meniscus, current injury, left knee, initial encounter: Secondary | ICD-10-CM | POA: Insufficient documentation

## 2018-10-24 DIAGNOSIS — M25562 Pain in left knee: Secondary | ICD-10-CM | POA: Insufficient documentation

## 2018-10-24 DIAGNOSIS — X58XXXA Exposure to other specified factors, initial encounter: Secondary | ICD-10-CM | POA: Insufficient documentation

## 2018-10-24 DIAGNOSIS — G8929 Other chronic pain: Secondary | ICD-10-CM

## 2018-10-24 DIAGNOSIS — Z6841 Body Mass Index (BMI) 40.0 and over, adult: Secondary | ICD-10-CM | POA: Insufficient documentation

## 2018-10-24 DIAGNOSIS — Z79891 Long term (current) use of opiate analgesic: Secondary | ICD-10-CM | POA: Diagnosis not present

## 2018-10-24 DIAGNOSIS — M25561 Pain in right knee: Secondary | ICD-10-CM

## 2018-10-24 NOTE — Progress Notes (Signed)
Patient's Name: Terri Jennings  MRN: [id number]  Referring Provider: [facility], [place]: April 30, 1956  PCP: [facility], [place]: 10/24/2018  Note by: Vevelyn Francois NP  Service setting: Ambulatory outpatient  Specialty: Interventional Pain Management  Location: [facility] (AMB) Pain Management Facility    Patient type: Established    Primary Reason(s) for Visit: Encounter for prescription drug management. (Level of risk: moderate)  CC: Knee Pain  HPI  Terri Jennings is a 62 y.o. year old, female patient, who comes today for a medication management evaluation. She has Chronic radicular low back pain; Chronic knee pain; Primary osteoarthritis of right knee; Morbid obesity ([place]); Chronic pain syndrome; Chronic pain of both knees (L>R); Long term current use of opiate analgesic; and Chronic musculoskeletal pain on their problem list. Her primarily concern today is the Knee Pain  Pain Assessment: Location: Right Knee Radiating: radiaties down to feet Onset: More than a month ago Duration: Chronic pain Quality: Throbbing, Aching, Sharp, Numbness, Burning, Discomfort, Constant Severity: 9 /10 (subjective, self-reported pain score)  Note: Reported level is compatible with observation. Clinically the patient looks like a 1/10 A 1/10 is viewed as "Mild" and described as nagging, annoying, but not interfering with basic activities of daily living (ADL). Terri Jennings is able to eat, bathe, get dressed, do toileting (being able to get on and off the toilet and perform personal hygiene functions), transfer (move in and out of bed or a chair without assistance), and maintain continence (able to control bladder and bowel functions). Physiologic parameters such as blood pressure and heart rate apear wnl.       When using our objective Pain Scale, levels between 6 and 10/10 are said to belong in an emergency room, as it progressively worsens from a 6/10, described as severely limiting,  requiring emergency care not usually available at an outpatient pain management facility. At a 6/10 level, communication becomes difficult and requires great effort. Assistance to reach the emergency department may be required. Facial flushing and profuse sweating along with potentially dangerous increases in heart rate and blood pressure will be evident. Effect on ADL: no prolonged walking or standing, having to hold on the wall Timing: Constant Modifying factors: pain medication and ice BP: (!) 145/83  HR: 85  Terri Jennings was last scheduled for an appointment on 08/25/2018 for medication management. During today's appointment we reviewed Terri Jennings's chronic pain status, as well as her outpatient medication regimen. She admits that her left knee is worse. She admits that the change in weather is making the pain worse. She does wear knee braces. She admits that she has weakness in her left knee. She feels like it is going to give out. She is not interested in any surgery at this time. She does not feel like the current oxycodone is effective.  She admits that she used to be on oxycodone 10 mg 4 times daily.  She has lost weight but has re-gained weight because she is unable to do much.  She is requesting to have Percocet 10 mg and cut the tablet in half because she will be going out of town and will not be able to get her medication filled on the next month. She admits that the Percocet is larger and makes cutting it easier.   The patient  reports that she does not use drugs. Her body mass index is 40.89 kg/m.  Further details on both, my assessment(s), as well as the proposed treatment plan,  please see below.  Controlled Substance Pharmacotherapy Assessment REMS (Risk Evaluation and Mitigation Strategy)  Analgesic: Oxycodone 5 mg 3 times daily MME/day: 22.5 mg/day. Chauncey Fischer, RN  10/24/2018  8:41 AM  Sign at close encounter Nursing Pain Medication Assessment:  Safety precautions to be  maintained throughout the outpatient stay will include: orient to surroundings, keep bed in low position, maintain call bell within reach at all times, provide assistance with transfer out of bed and ambulation.  Medication Inspection Compliance: Pill count conducted under aseptic conditions, in front of the patient. Neither the pills nor the bottle was removed from the patient's sight at any time. Once count was completed pills were immediately returned to the patient in their original bottle.  Medication: Oxycodone IR Pill/Patch Count: 14 of 90 pills remain Pill/Patch Appearance: Markings consistent with prescribed medication Bottle Appearance: Standard pharmacy container. Clearly labeled. Filled Date: 10 / 31 / 2019 Last Medication intake:  Today   Pharmacokinetics: Liberation and absorption (onset of action): Shorter than expected. Distribution (time to peak effect): Shorter than expected. Metabolism and excretion (duration of action): WNL         Pharmacodynamics: Desired effects: Analgesia: Terri Jennings reports <50% benefit. Functional ability: Patient reports that medication does help, but not nearly as much as she would like Clinically meaningful improvement in function (CMIF): Medication does not meet basic CMIF Perceived effectiveness: Described as ineffective and would like to make some changes Undesirable effects: Side-effects or Adverse reactions: None reported Monitoring: [place] PMP: Online review of the past 80-monthperiod conducted. Compliant with practice rules and regulations Last UDS on record: Summary  Date Value Ref Range Status  08/25/2018 FINAL  Final    Comment:    ==================================================================== TOXASSURE SELECT 13 (MW) ==================================================================== Test                             Result       Flag       Units Drug Present not Declared for Prescription Verification   Hydrocodone                     384          UNEXPECTED ng/mg creat   Norhydrocodone                 184          UNEXPECTED ng/mg creat    Sources of hydrocodone include scheduled prescription    medications. Norhydrocodone is an expected metabolite of    hydrocodone. Drug Absent but Declared for Prescription Verification   Oxycodone                      Not Detected UNEXPECTED ng/mg creat ==================================================================== Test                      Result    Flag   Units      Ref Range   Creatinine              193              mg/dL      >=20 ==================================================================== Declared Medications:  The flagging and interpretation on this report are based on the  following declared medications.  Unexpected results may arise from  inaccuracies in the declared medications.  **Note: The testing scope of this panel includes these medications:  Oxycodone  **  Note: The testing scope of this panel does not include following  reported medications:  Amlodipine Besylate  Atorvastatin  Atropine  Baclofen (Lioresal)  Diclofenac (Voltaren)  Gabapentin (Neurontin)  Lisinopril  Prednisolone ==================================================================== For clinical consultation, please call [phone]. ====================================================================    UDS interpretation: Unexpected findings not considered significantly abnormal The patient was given a final warning about the accuracy of reporting medications. Medication Assessment Form: Reviewed. Patient indicates being compliant with therapy Treatment compliance: Deficiencies noted and steps taken to remind the patient of the seriousness of adequate therapy compliance Risk Assessment Profile: Aberrant behavior: See prior evaluations. None observed or detected today Comorbid factors increasing risk of overdose: See prior notes. No additional risks detected  today Opioid risk tool (ORT) (Total Score): 2 Personal History of Substance Abuse (SUD-Substance use disorder):  Alcohol: Negative  Illegal Drugs: Negative  Rx Drugs: Negative  ORT Risk Level calculation: Low Risk Risk of substance use disorder (SUD): Moderate Opioid Risk Tool - 10/24/18 0855      Family History of Substance Abuse   Alcohol  Negative    Illegal Drugs  Negative    Rx Drugs  Negative      Personal History of Substance Abuse   Alcohol  Negative    Illegal Drugs  Negative    Rx Drugs  Negative      Age   Age between 39-45 years   No      History of Preadolescent Sexual Abuse   History of Preadolescent Sexual Abuse  Negative or Female      Psychological Disease   Psychological Disease  Positive    ADD  Positive    OCD  Negative    Bipolar  Negative    Schizophrenia  Negative    Depression  Negative      Total Score   Opioid Risk Tool Scoring  2    Opioid Risk Interpretation  Low Risk      ORT Scoring interpretation table:  Score <3 = Low Risk for SUD  Score between 4-7 = Moderate Risk for SUD  Score >8 = High Risk for Opioid Abuse   Risk Mitigation Strategies:  Patient Counseling: Covered Patient-Prescriber Agreement (PPA): Present and active  Notification to other healthcare providers: Done  Pharmacologic Plan: No change in therapy, at this time.             Laboratory Chemistry  Inflammation Markers (CRP: Acute Phase) (ESR: Chronic Phase) No results found for: CRP, ESRSEDRATE, LATICACIDVEN                       Rheumatology Markers No results found for: RF, ANA, LABURIC, URICUR, LYMEIGGIGMAB, LYMEABIGMQN, HLAB27                      Renal Function Markers No results found for: BUN, CREATININE, BCR, GFRAA, GFRNONAA                           Hepatic Function Markers No results found for: AST, ALT, ALBUMIN, ALKPHOS, HCVAB, AMYLASE, LIPASE, AMMONIA                      Electrolytes No results found for: NA, K, CL, CALCIUM, MG, PHOS                       Neuropathy Markers No results found for: VITAMINB12, FOLATE, HGBA1C, HIV  CNS Tests No results found for: COLORCSF, APPEARCSF, RBCCOUNTCSF, WBCCSF, POLYSCSF, LYMPHSCSF, EOSCSF, PROTEINCSF, GLUCCSF, JCVIRUS, CSFOLI, IGGCSF                      Bone Pathology Markers No results found for: VD25OH, [id number], [id number], [id number], 25OHVITD1, 25OHVITD2, 25OHVITD3, TESTOFREE, TESTOSTERONE                       Coagulation Parameters No results found for: INR, LABPROT, APTT, PLT, DDIMER, LABHEMA                      Cardiovascular Markers No results found for: BNP, CKTOTAL, CKMB, TROPONINI, HGB, HCT                       CA Markers No results found for: CEA, CA125, LABCA2                      Note: Lab results reviewed.  Recent Diagnostic Imaging Results  MR KNEE LEFT WO CONTRAST CLINICAL DATA:  Left knee pain since May 2019.  EXAM: MRI OF THE LEFT KNEE WITHOUT CONTRAST  TECHNIQUE: Multiplanar, multisequence MR imaging of the knee was performed. No intravenous contrast was administered.  COMPARISON:  None.  FINDINGS: MENISCI  Medial meniscus: Radial tear of the posterior horn of the medial meniscus with peripheral meniscal extrusion. Small undersurface tear of the body of the medial meniscus.  Lateral meniscus:  Intact.  LIGAMENTS  Cruciates:  Intact ACL and PCL.  Collaterals: Medial collateral ligament is intact. Lateral collateral ligament complex is intact.  CARTILAGE  Patellofemoral: Partial-thickness cartilage loss of the patellar apex with minimal subchondral reactive marrow changes. High-grade partial-thickness cartilage loss with areas of full-thickness cartilage loss of the trochlear groove with subchondral reactive marrow changes.  Medial: High-grade partial-thickness cartilage loss with areas of full-thickness cartilage loss of the medial femoral condyle and medial tibial plateau. Osteochondral lesion involving  the weight-bearing surface of the medial femoral condyle measuring 10 x 23 mm with cystic changes and surrounding marrow edema at the OCL-bone interface.  Lateral:  No chondral defect.  Joint: Small joint effusion. Normal Hoffa's fat. No plical thickening.  Popliteal Fossa:  Tiny Baker's cyst.  Intact popliteus tendon.  Extensor Mechanism: Intact quadriceps tendon. Intact patellar tendon. Intact medial patellar retinaculum. Intact lateral patellar retinaculum. Intact MPFL.  Bones:  No acute osseous abnormality.  No aggressive osseous lesion.  Other: No fluid collection or hematoma.  IMPRESSION: 1. Radial tear of the posterior horn of the medial meniscus with peripheral meniscal extrusion. Small undersurface tear of the body of the medial meniscus. 2. Partial-thickness cartilage loss of the patellar apex with minimal subchondral reactive marrow changes. High-grade partial-thickness cartilage loss with areas of full-thickness cartilage loss of the trochlear groove with subchondral reactive marrow changes. 3. High-grade partial-thickness cartilage loss with areas of full-thickness cartilage loss of the medial femoral condyle and medial tibial plateau. Osteochondral lesion involving the weight-bearing surface of the medial femoral condyle measuring 10 x 23 mm with cystic changes and surrounding marrow edema at the OCL-bone interface.  Electronically Signed   By: Kathreen Devoid   On: 08/09/2018 11:58  Complexity Note: Imaging results reviewed. Results shared with Terri Jennings, using Layman's terms.                         Meds   Current Outpatient Medications:  .  AMLODIPINE BESYLATE PO, Take 10 mg by mouth. , Disp: , Rfl:  .  atropine 1 % ophthalmic solution, Place 1 drop into the right eye daily., Disp: , Rfl: 6 .  diclofenac (VOLTAREN) 75 MG EC tablet, Take 1 tablet (75 mg total) by mouth 2 (two) times daily., Disp: 60 tablet, Rfl: 1 .  fenofibrate (TRICOR) 145 MG tablet,  Take 145 mg by mouth daily., Disp: , Rfl:  .  gabapentin (NEURONTIN) 300 MG capsule, 300 mg qday, 600 mg qhs, Disp: 90 capsule, Rfl: 1 .  lisinopril (PRINIVIL,ZESTRIL) 20 MG tablet, Take 25 mg by mouth daily. , Disp: , Rfl:  .  prednisoLONE acetate (PRED FORTE) 1 % ophthalmic suspension, Place 1 drop into the right eye 3 (three) times daily., Disp: , Rfl: 3 .  rosuvastatin (CRESTOR) 20 MG tablet, Take 20 mg by mouth daily., Disp: , Rfl:  .  oxyCODONE-acetaminophen (PERCOCET) 10-325 MG tablet, Take 0.5 tablets by mouth every 8 (eight) hours as needed for pain., Disp: 90 tablet, Rfl: 0  ROS  Constitutional: Denies any fever or chills Gastrointestinal: No reported hemesis, hematochezia, vomiting, or acute GI distress Musculoskeletal: Denies any acute onset joint swelling, redness, loss of ROM, or weakness Neurological: No reported episodes of acute onset apraxia, aphasia, dysarthria, agnosia, amnesia, paralysis, loss of coordination, or loss of consciousness  Allergies  Terri Jennings is allergic to morphine and related; oxycontin [oxycodone hcl]; tramadol; and hydrocodone.  [place]  Drug: Terri Jennings  reports that she does not use drugs. Alcohol:  reports that she does not drink alcohol. Tobacco:  reports that she has never smoked. She has never used smokeless tobacco. Medical:  has a past medical history of Arthritis, Dysrhythmia, Enlarged heart, and Hypertension. Surgical: Terri Jennings  has a past surgical history that includes Knee surgery (Right, 2009) and Cataract extraction w/PHACO (Right, 03/18/2017). Family: family history includes Cancer in her maternal grandmother; Stroke in her father.  Constitutional Exam  General appearance: Well nourished, well developed, and well hydrated. In no apparent acute distress Vitals:   10/24/18 0841  BP: (!) 145/83  Pulse: 85  Temp: 98 F (36.7 C)  SpO2: 98%  Weight: 285 lb (129.3 kg)  Height: _0  (1.778 m)  Psych/Mental status: Alert, oriented x  3 (person, place, & time)       Eyes: PERLA Respiratory: No evidence of acute respiratory distress  Lumbar Spine Area Exam  Skin & Axial Inspection: No masses, redness, or swelling Alignment: Symmetrical Functional ROM: Unrestricted ROM       Stability: No instability detected Muscle Tone/Strength: Functionally intact. No obvious neuro-muscular anomalies detected. Sensory (Neurological): Unimpaired Palpation: No palpable anomalies         Gait & Posture Assessment  Ambulation: Unassisted Gait: Relatively normal for age and body habitus Posture: WNL   Lower Extremity Exam    Side: Right lower extremity  Side: Left lower extremity  Stability: No instability observed          Stability: No instability observed          Skin & Extremity Inspection: Skin color, temperature, and hair growth are WNL. No peripheral edema or cyanosis. No masses, redness, swelling, asymmetry, or associated skin lesions. No contractures.  Skin & Extremity Inspection: Skin color, temperature, and hair growth are WNL. No peripheral edema or cyanosis. No masses, redness, swelling, asymmetry, or associated skin lesions. No contractures.  Functional ROM: Unrestricted ROM  Functional ROM: Unrestricted ROM                  Muscle Tone/Strength: Functionally intact. No obvious neuro-muscular anomalies detected.  Muscle Tone/Strength: Functionally intact. No obvious neuro-muscular anomalies detected.  Sensory (Neurological): Unimpaired        Sensory (Neurological): Unimpaired            Palpation: No palpable anomalies  Palpation: No palpable anomalies   Assessment  Primary Diagnosis & Pertinent Problem List: The primary encounter diagnosis was Chronic pain of both knees (L>R). Diagnoses of Chronic radicular low back pain, Chronic musculoskeletal pain, Chronic pain syndrome, and Long term current use of opiate analgesic were also pertinent to this visit.  Status Diagnosis   Worsening Persistent Persistent 1. Chronic pain of both knees (L>R)   2. Chronic radicular low back pain   3. Chronic musculoskeletal pain   4. Chronic pain syndrome   5. Long term current use of opiate analgesic     Problems updated and reviewed during this visit: No problems updated. Plan of Care  Pharmacotherapy (Medications Ordered): Meds ordered this encounter  Medications  . diclofenac (VOLTAREN) 75 MG EC tablet    Sig: Take 1 tablet (75 mg total) by mouth 2 (two) times daily.    Dispense:  60 tablet    Refill:  1    Order Specific Question:   Supervising Provider    Answer:   Milinda Pointer [phone]  . gabapentin (NEURONTIN) 300 MG capsule    Sig: 300 mg qday, 600 mg qhs    Dispense:  90 capsule    Refill:  1    Order Specific Question:   Supervising Provider    AnswerMilinda Pointer [phone]  . oxyCODONE-acetaminophen (PERCOCET) 10-325 MG tablet    Sig: Take 0.5 tablets by mouth every 8 (eight) hours as needed for pain.    Dispense:  90 tablet    Refill:  0 60 day supply    Do not place this medication, or any other prescription from our practice, on "Automatic Refill". Patient may have prescription filled one day early if pharmacy is closed on scheduled refill date.    Order Specific Question:   Supervising Provider    Answer:   Milinda Pointer [phone]   New Prescriptions   OXYCODONE-ACETAMINOPHEN (PERCOCET) 10-325 MG TABLET    Take 0.5 tablets by mouth every 8 (eight) hours as needed for pain.   Medications administered today: Genella Rife had no medications administered during this visit. Lab-work, procedure(s), and/or referral(s): Orders Placed This Encounter  Procedures  . KNEE INJECTION  . ToxASSURE Select 13 (MW), Urine   Imaging and/or referral(s): None  Interventional therapies: Planned, scheduled, and/or pending:   Hyalgan series in left knee.      Provider-requested follow-up: Return in about 2 months (around 12/24/2018)  for MedMgmt, and, Procedure(NS), w/ Dr. Holley Raring.  Future Appointments  Date Time Provider [place]  12/14/2018  9:00 AM Vevelyn Francois, NP [facility] None  12/19/2018  8:45 AM Gillis Santa, MD [facility] None   Primary Care Physician: [facility], [place] Location: [facility] Outpatient Pain Management Facility Note by: Vevelyn Francois NP Date: 10/24/2018; Time: 10:21 AM  Pain Score Disclaimer: We use the NRS-11 scale. This is a self-reported, subjective measurement of pain severity with only modest accuracy. It is used primarily to identify changes within a particular patient. It must be understood that outpatient pain scales are significantly less accurate that those  used for research, where they can be applied under ideal controlled circumstances with minimal exposure to variables. In reality, the score is likely to be a combination of pain intensity and pain affect, where pain affect describes the degree of emotional arousal or changes in action readiness caused by the sensory experience of pain. Factors such as social and work situation, setting, emotional state, anxiety levels, expectation, and prior pain experience may influence pain perception and show large inter-individual differences that may also be affected by time variables.  Patient instructions provided during this appointment: Patient Instructions   ____________________________________________________________________________________________  Medication Rules  Purpose: To inform patients, and their family members, of our rules and regulations.  Applies to: All patients receiving prescriptions (written or electronic).  Pharmacy of record: Pharmacy where electronic prescriptions will be sent. If written prescriptions are taken to a different pharmacy, please inform the nursing staff. The pharmacy listed in the electronic medical record should be the one where you would like electronic prescriptions to be  sent.  Electronic prescriptions: In compliance with the [place] (STOP) Act of 2017 (Session Lanny Cramp [phone]), effective November 30, 2018, all controlled substances must be electronically prescribed. Calling prescriptions to the pharmacy will cease to exist.  Prescription refills: Only during scheduled appointments. Applies to all prescriptions.  NOTE: The following applies primarily to controlled substances (Opioid* Pain Medications).   Patient's responsibilities: 1. Pain Pills: Bring all pain pills to every appointment (except for procedure appointments). 2. Pill Bottles: Bring pills in original pharmacy bottle. Always bring the newest bottle. Bring bottle, even if empty. 3. Medication refills: You are responsible for knowing and keeping track of what medications you take and those you need refilled. The day before your appointment: write a list of all prescriptions that need to be refilled. The day of the appointment: give the list to the admitting nurse. Prescriptions will be written only during appointments. If you forget a medication: it will not be "Called in", "Faxed", or "electronically sent". You will need to get another appointment to get these prescribed. No early refills. Do not call asking to have your prescription filled early. 4. Prescription Accuracy: You are responsible for carefully inspecting your prescriptions before leaving our office. Have the discharge nurse carefully go over each prescription with you, before taking them home. Make sure that your name is accurately spelled, that your address is correct. Check the name and dose of your medication to make sure it is accurate. Check the number of pills, and the written instructions to make sure they are clear and accurate. Make sure that you are given enough medication to last until your next medication refill appointment. 5. Taking Medication: Take medication as prescribed. When it  comes to controlled substances, taking less pills or less frequently than prescribed is permitted and encouraged. Never take more pills than instructed. Never take medication more frequently than prescribed.  6. Inform other Doctors: Always inform, all of your healthcare providers, of all the medications you take. 7. Pain Medication from other Providers: You are not allowed to accept any additional pain medication from any other Doctor or Healthcare provider. There are two exceptions to this rule. (see below) In the event that you require additional pain medication, you are responsible for notifying us, as stated below. 8. Medication Agreement: You are responsible for carefully reading and following our Medication Agreement. This must be signed before receiving any prescriptions from our practice. Safely store a copy of your signed Agreement. Violations  to the Agreement will result in no further prescriptions. (Additional copies of our Medication Agreement are available upon request.) 9. Laws, Rules, & Regulations: All patients are expected to follow all [employer] and [employer], [employer], Rules, [facility]. Ignorance of the Laws does not constitute a valid excuse. The use of any illegal substances is prohibited. 10. Adopted CDC guidelines & recommendations: Target dosing levels will be at or below 60 MME/day. Use of benzodiazepines** is not recommended.  Exceptions: There are only two exceptions to the rule of not receiving pain medications from other Healthcare Providers. 1. Exception #1 (Emergencies): In the event of an emergency (i.e.: accident requiring emergency care), you are allowed to receive additional pain medication. However, you are responsible for: As soon as you are able, call our office [phone], at any time of the day or night, and leave a message stating your name, the date and nature of the emergency, and the name and dose of the medication prescribed. In the event that your call  is answered by a member of our staff, make sure to document and save the date, time, and the name of the person that took your information.  2. Exception #2 (Planned Surgery): In the event that you are scheduled by another doctor or dentist to have any type of surgery or procedure, you are allowed (for a period no longer than 30 days), to receive additional pain medication, for the acute post-op pain. However, in this case, you are responsible for picking up a copy of our "Post-op Pain Management for Surgeons" handout, and giving it to your surgeon or dentist. This document is available at our office, and does not require an appointment to obtain it. Simply go to our office during business hours (Monday-Thursday from 8:00 AM to 4:00 PM) (Friday 8:00 AM to 12:00 Noon) or if you have a scheduled appointment with [place], prior to your surgery, and ask for it by name. In addition, you will need to provide [place] with your name, name of your surgeon, type of surgery, and date of procedure or surgery.  *Opioid medications include: morphine, codeine, oxycodone, oxymorphone, hydrocodone, hydromorphone, meperidine, tramadol, tapentadol, buprenorphine, fentanyl, methadone. **Benzodiazepine medications include: diazepam (Valium), alprazolam (Xanax), clonazepam (Klonopine), lorazepam (Ativan), clorazepate (Tranxene), chlordiazepoxide (Librium), estazolam (Prosom), oxazepam (Serax), temazepam (Restoril), triazolam (Halcion) (Last updated: 01/27/2018) ____________________________________________________________________________________________  ______________________________________________________________________________________________  Specialty Pain Scale  Introduction:  There are significant differences in how pain is reported. The word pain usually refers to physical pain, but it is also a common synonym of suffering. The medical community uses a scale from 0 (zero) to 10 (ten) to report pain level. Zero (0) is  described as "no pain", while ten (10) is described as "the worse pain you can imagine". The problem with this scale is that physical pain is reported along with suffering. Suffering refers to mental pain, or more often yet it refers to any unpleasant feeling, emotion or aversion associated with the perception of harm or threat of harm. It is the psychological component of pain.  Pain Specialists prefer to separate the two components. The pain scale used by this practice is the Verbal Numerical Rating Scale (VNRS-11). This scale is for the physical pain only. DO NOT INCLUDE how your pain psychologically affects you. This scale is for adults 77 years of age and older. It has 11 (eleven) levels. The 1st level is 0/10. This means: "right now, I have no pain". In the context of pain management, it also means: "right now,  my physical pain is under control with the current therapy".  General Information:  The scale should reflect your current level of pain. Unless you are specifically asked for the level of your worst pain, or your average pain. If you are asked for one of these two, then it should be understood that it is over the past 24 hours.  Levels 1 (one) through 5 (five) are described below, and can be treated as an outpatient. Ambulatory pain management facilities such as ours are more than adequate to treat these levels. Levels 6 (six) through 10 (ten) are also described below, however, these must be treated as a hospitalized patient. While levels 6 (six) and 7 (seven) may be evaluated at an urgent care facility, levels 8 (eight) through 10 (ten) constitute medical emergencies and as such, they belong in a hospital's emergency department. When having these levels (as described below), do not come to our office. Our facility is not equipped to manage these levels. Go directly to an urgent care facility or an emergency department to be evaluated.  Definitions:  Activities of Daily Living (ADL): Activities  of daily living (ADL or ADLs) is a term used in healthcare to refer to people's daily self-care activities. Health professionals often use a person's ability or inability to perform ADLs as a measurement of their functional status, particularly in regard to people post injury, with disabilities and the elderly. There are two ADL levels: Basic and Instrumental. Basic Activities of Daily Living (BADL  or BADLs) consist of self-care tasks that include: Bathing and showering; personal hygiene and grooming (including brushing/combing/styling hair); dressing; Toilet hygiene (getting to the toilet, cleaning oneself, and getting back up); eating and self-feeding (not including cooking or chewing and swallowing); functional mobility, often referred to as "transferring", as measured by the ability to walk, get in and out of bed, and get into and out of a chair; the broader definition (moving from one place to another while performing activities) is useful for people with different physical abilities who are still able to get around independently. Basic ADLs include the things many people do when they get up in the morning and get ready to go out of the house: get out of bed, go to the toilet, bathe, dress, groom, and eat. On the average, loss of function typically follows a particular order. Hygiene is the first to go, followed by loss of toilet use and locomotion. The last to go is the ability to eat. When there is only one remaining area in which the person is independent, there is a 62.9% chance that it is eating and only a 3.5% chance that it is hygiene. Instrumental Activities of Daily Living (IADL or IADLs) are not necessary for fundamental functioning, but they let an individual live independently in a community. IADL consist of tasks that include: cleaning and maintaining the house; home establishment and maintenance; care of others (including selecting and supervising caregivers); care of pets; child rearing;  managing money; managing financials (investments, etc.); meal preparation and cleanup; shopping for groceries and necessities; moving within the community; safety procedures and emergency responses; health management and maintenance (taking prescribed medications); and using the telephone or other form of communication.  Instructions:  Most patients tend to report their pain as a combination of two factors, their physical pain and their psychosocial pain. This last one is also known as "suffering" and it is reflection of how physical pain affects you socially and psychologically. From now on, report them separately.  From this point on, when asked to report your pain level, report only your physical pain. Use the following table for reference.  Pain Clinic Pain Levels (0-5/10)  Pain Level Score  Description  No Pain 0   Mild pain 1 Nagging, annoying, but does not interfere with basic activities of daily living (ADL). Patients are able to eat, bathe, get dressed, toileting (being able to get on and off the toilet and perform personal hygiene functions), transfer (move in and out of bed or a chair without assistance), and maintain continence (able to control bladder and bowel functions). Blood pressure and heart rate are unaffected. A normal heart rate for a healthy adult ranges from 60 to 100 bpm (beats per minute).   Mild to moderate pain 2 Noticeable and distracting. Impossible to hide from other people. More frequent flare-ups. Still possible to adapt and function close to normal. It can be very annoying and may have occasional stronger flare-ups. With discipline, patients may get used to it and adapt.   Moderate pain 3 Interferes significantly with activities of daily living (ADL). It becomes difficult to feed, bathe, get dressed, get on and off the toilet or to perform personal hygiene functions. Difficult to get in and out of bed or a chair without assistance. Very distracting. With effort, it can  be ignored when deeply involved in activities.   Moderately severe pain 4 Impossible to ignore for more than a few minutes. With effort, patients may still be able to manage work or participate in some social activities. Very difficult to concentrate. Signs of autonomic nervous system discharge are evident: dilated pupils (mydriasis); mild sweating (diaphoresis); sleep interference. Heart rate becomes elevated (>115 bpm). Diastolic blood pressure (lower number) rises above 100 mmHg. Patients find relief in laying down and not moving.   Severe pain 5 Intense and extremely unpleasant. Associated with frowning face and frequent crying. Pain overwhelms the senses.  Ability to do any activity or maintain social relationships becomes significantly limited. Conversation becomes difficult. Pacing back and forth is common, as getting into a comfortable position is nearly impossible. Pain wakes you up from deep sleep. Physical signs will be obvious: pupillary dilation; increased sweating; goosebumps; brisk reflexes; cold, clammy hands and feet; nausea, vomiting or dry heaves; loss of appetite; significant sleep disturbance with inability to fall asleep or to remain asleep. When persistent, significant weight loss is observed due to the complete loss of appetite and sleep deprivation.  Blood pressure and heart rate becomes significantly elevated. Caution: If elevated blood pressure triggers a pounding headache associated with blurred vision, then the patient should immediately seek attention at an urgent or emergency care unit, as these may be signs of an impending stroke.    Emergency Department Pain Levels (6-10/10)  Emergency Room Pain 6 Severely limiting. Requires emergency care and should not be seen or managed at an outpatient pain management facility. Communication becomes difficult and requires great effort. Assistance to reach the emergency department may be required. Facial flushing and profuse sweating  along with potentially dangerous increases in heart rate and blood pressure will be evident.   Distressing pain 7 Self-care is very difficult. Assistance is required to transport, or use restroom. Assistance to reach the emergency department will be required. Tasks requiring coordination, such as bathing and getting dressed become very difficult.   Disabling pain 8 Self-care is no longer possible. At this level, pain is disabling. The individual is unable to do even the most "basic" activities such as  walking, eating, bathing, dressing, transferring to a bed, or toileting. Fine motor skills are lost. It is difficult to think clearly.   Incapacitating pain 9 Pain becomes incapacitating. Thought processing is no longer possible. Difficult to remember your own name. Control of movement and coordination are lost.   The worst pain imaginable 10 At this level, most patients pass out from pain. When this level is reached, collapse of the autonomic nervous system occurs, leading to a sudden drop in blood pressure and heart rate. This in turn results in a temporary and dramatic drop in blood flow to the brain, leading to a loss of consciousness. Fainting is one of the body's self defense mechanisms. Passing out puts the brain in a calmed state and causes it to shut down for a while, in order to begin the healing process.    Summary: 1. Refer to this scale when providing [place] with your pain level. 2. Be accurate and careful when reporting your pain level. This will help with your care. 3. Over-reporting your pain level will lead to loss of credibility. 4. Even a level of 1/10 means that there is pain and will be treated at our facility. 5. High, inaccurate reporting will be documented as "Symptom Exaggeration", leading to loss of credibility and suspicions of possible secondary gains such as obtaining more narcotics, or wanting to appear disabled, for fraudulent reasons. 6. Only pain levels of 5 or below will  be seen at our facility. 7. Pain levels of 6 and above will be sent to the Emergency Department and the appointment cancelled. ______________________________________________________________________________________________      BMI Assessment: Estimated body mass index is 40.89 kg/m as calculated from the following:   Height as of this encounter: _0  (1.778 m).   Weight as of this encounter: 285 lb (129.3 kg).  BMI interpretation table: BMI level Category Range association with higher incidence of chronic pain  <18 kg/m2 Underweight   18.5-24.9 kg/m2 Ideal body weight   25-29.9 kg/m2 Overweight Increased incidence by 20%  30-34.9 kg/m2 Obese (Class I) Increased incidence by 68%  35-39.9 kg/m2 Severe obesity (Class II) Increased incidence by 136%  >40 kg/m2 Extreme obesity (Class III) Increased incidence by 254%   Patient's current BMI Ideal Body weight  Body mass index is 40.89 kg/m. Ideal body weight: 68.5 kg (151 lb 0.2 oz) Adjusted ideal body weight: 92.8 kg (204 lb 9.7 oz)   BMI Readings from Last 4 Encounters:  10/24/18 40.89 kg/m  08/25/18 38.74 kg/m  07/21/18 38.74 kg/m  05/16/18 40.18 kg/m   Wt Readings from Last 4 Encounters:  10/24/18 285 lb (129.3 kg)  08/25/18 270 lb (122.5 kg)  07/21/18 270 lb (122.5 kg)  05/16/18 280 lb (127 kg)

## 2018-10-24 NOTE — Patient Instructions (Addendum)
____________________________________________________________________________________________  Medication Rules  Purpose: To inform patients, and their family members, of our rules and regulations.  Applies to: All patients receiving prescriptions (written or electronic).  Pharmacy of record: Pharmacy where electronic prescriptions will be sent. If written prescriptions are taken to a different pharmacy, please inform the nursing staff. The pharmacy listed in the electronic medical record should be the one where you would like electronic prescriptions to be sent.  Electronic prescriptions: In compliance with the [place] Strengthen Opioid Misuse Prevention (STOP) Act of 2017 (Session Law 2017-74/H243), effective November 30, 2018, all controlled substances must be electronically prescribed. Calling prescriptions to the pharmacy will cease to exist.  Prescription refills: Only during scheduled appointments. Applies to all prescriptions.  NOTE: The following applies primarily to controlled substances (Opioid* Pain Medications).   Patient's responsibilities: 1. Pain Pills: Bring all pain pills to every appointment (except for procedure appointments). 2. Pill Bottles: Bring pills in original pharmacy bottle. Always bring the newest bottle. Bring bottle, even if empty. 3. Medication refills: You are responsible for knowing and keeping track of what medications you take and those you need refilled. The day before your appointment: write a list of all prescriptions that need to be refilled. The day of the appointment: give the list to the admitting nurse. Prescriptions will be written only during appointments. If you forget a medication: it will not be "Called in", "Faxed", or "electronically sent". You will need to get another appointment to get these prescribed. No early refills. Do not call asking to have your prescription filled early. 4. Prescription Accuracy: You are responsible for  carefully inspecting your prescriptions before leaving our office. Have the discharge nurse carefully go over each prescription with you, before taking them home. Make sure that your name is accurately spelled, that your address is correct. Check the name and dose of your medication to make sure it is accurate. Check the number of pills, and the written instructions to make sure they are clear and accurate. Make sure that you are given enough medication to last until your next medication refill appointment. 5. Taking Medication: Take medication as prescribed. When it comes to controlled substances, taking less pills or less frequently than prescribed is permitted and encouraged. Never take more pills than instructed. Never take medication more frequently than prescribed.  6. Inform other Doctors: Always inform, all of your healthcare providers, of all the medications you take. 7. Pain Medication from other Providers: You are not allowed to accept any additional pain medication from any other Doctor or Healthcare provider. There are two exceptions to this rule. (see below) In the event that you require additional pain medication, you are responsible for notifying us, as stated below. 8. Medication Agreement: You are responsible for carefully reading and following our Medication Agreement. This must be signed before receiving any prescriptions from our practice. Safely store a copy of your signed Agreement. Violations to the Agreement will result in no further prescriptions. (Additional copies of our Medication Agreement are available upon request.) 9. Laws, Rules, & Regulations: All patients are expected to follow all Federal and State Laws, Statutes, Rules, & Regulations. Ignorance of the Laws does not constitute a valid excuse. The use of any illegal substances is prohibited. 10. Adopted CDC guidelines & recommendations: Target dosing levels will be at or below 60 MME/day. Use of benzodiazepines** is not  recommended.  Exceptions: There are only two exceptions to the rule of not receiving pain medications from other Healthcare Providers. 1.   Exception #1 (Emergencies): In the event of an emergency (i.e.: accident requiring emergency care), you are allowed to receive additional pain medication. However, you are responsible for: As soon as you are able, call our office [phone], at any time of the day or night, and leave a message stating your name, the date and nature of the emergency, and the name and dose of the medication prescribed. In the event that your call is answered by a member of our staff, make sure to document and save the date, time, and the name of the person that took your information.  2. Exception #2 (Planned Surgery): In the event that you are scheduled by another doctor or dentist to have any type of surgery or procedure, you are allowed (for a period no longer than 30 days), to receive additional pain medication, for the acute post-op pain. However, in this case, you are responsible for picking up a copy of our "Post-op Pain Management for Surgeons" handout, and giving it to your surgeon or dentist. This document is available at our office, and does not require an appointment to obtain it. Simply go to our office during business hours (Monday-Thursday from 8:00 AM to 4:00 PM) (Friday 8:00 AM to 12:00 Noon) or if you have a scheduled appointment with [place], prior to your surgery, and ask for it by name. In addition, you will need to provide [place] with your name, name of your surgeon, type of surgery, and date of procedure or surgery.  *Opioid medications include: morphine, codeine, oxycodone, oxymorphone, hydrocodone, hydromorphone, meperidine, tramadol, tapentadol, buprenorphine, fentanyl, methadone. **Benzodiazepine medications include: diazepam (Valium), alprazolam (Xanax), clonazepam (Klonopine), lorazepam (Ativan), clorazepate (Tranxene), chlordiazepoxide (Librium), estazolam (Prosom),  oxazepam (Serax), temazepam (Restoril), triazolam (Halcion) (Last updated: 01/27/2018) ____________________________________________________________________________________________  ______________________________________________________________________________________________  Specialty Pain Scale  Introduction:  There are significant differences in how pain is reported. The word pain usually refers to physical pain, but it is also a common synonym of suffering. The medical community uses a scale from 0 (zero) to 10 (ten) to report pain level. Zero (0) is described as "no pain", while ten (10) is described as "the worse pain you can imagine". The problem with this scale is that physical pain is reported along with suffering. Suffering refers to mental pain, or more often yet it refers to any unpleasant feeling, emotion or aversion associated with the perception of harm or threat of harm. It is the psychological component of pain.  Pain Specialists prefer to separate the two components. The pain scale used by this practice is the Verbal Numerical Rating Scale (VNRS-11). This scale is for the physical pain only. DO NOT INCLUDE how your pain psychologically affects you. This scale is for adults 41 years of age and older. It has 11 (eleven) levels. The 1st level is 0/10. This means: "right now, I have no pain". In the context of pain management, it also means: "right now, my physical pain is under control with the current therapy".  General Information:  The scale should reflect your current level of pain. Unless you are specifically asked for the level of your worst pain, or your average pain. If you are asked for one of these two, then it should be understood that it is over the past 24 hours.  Levels 1 (one) through 5 (five) are described below, and can be treated as an outpatient. Ambulatory pain management facilities such as ours are more than adequate to treat these levels. Levels 6 (six) through 10  (ten)  are also described below, however, these must be treated as a hospitalized patient. While levels 6 (six) and 7 (seven) may be evaluated at an urgent care facility, levels 8 (eight) through 10 (ten) constitute medical emergencies and as such, they belong in a hospital's emergency department. When having these levels (as described below), do not come to our office. Our facility is not equipped to manage these levels. Go directly to an urgent care facility or an emergency department to be evaluated.  Definitions:  Activities of Daily Living (ADL): Activities of daily living (ADL or ADLs) is a term used in healthcare to refer to people's daily self-care activities. Health professionals often use a person's ability or inability to perform ADLs as a measurement of their functional status, particularly in regard to people post injury, with disabilities and the elderly. There are two ADL levels: Basic and Instrumental. Basic Activities of Daily Living (BADL  or BADLs) consist of self-care tasks that include: Bathing and showering; personal hygiene and grooming (including brushing/combing/styling hair); dressing; Toilet hygiene (getting to the toilet, cleaning oneself, and getting back up); eating and self-feeding (not including cooking or chewing and swallowing); functional mobility, often referred to as "transferring", as measured by the ability to walk, get in and out of bed, and get into and out of a chair; the broader definition (moving from one place to another while performing activities) is useful for people with different physical abilities who are still able to get around independently. Basic ADLs include the things many people do when they get up in the morning and get ready to go out of the house: get out of bed, go to the toilet, bathe, dress, groom, and eat. On the average, loss of function typically follows a particular order. Hygiene is the first to go, followed by loss of toilet use and  locomotion. The last to go is the ability to eat. When there is only one remaining area in which the person is independent, there is a 62.9% chance that it is eating and only a 3.5% chance that it is hygiene. Instrumental Activities of Daily Living (IADL or IADLs) are not necessary for fundamental functioning, but they let an individual live independently in a community. IADL consist of tasks that include: cleaning and maintaining the house; home establishment and maintenance; care of others (including selecting and supervising caregivers); care of pets; child rearing; managing money; managing financials (investments, etc.); meal preparation and cleanup; shopping for groceries and necessities; moving within the community; safety procedures and emergency responses; health management and maintenance (taking prescribed medications); and using the telephone or other form of communication.  Instructions:  Most patients tend to report their pain as a combination of two factors, their physical pain and their psychosocial pain. This last one is also known as "suffering" and it is reflection of how physical pain affects you socially and psychologically. From now on, report them separately.  From this point on, when asked to report your pain level, report only your physical pain. Use the following table for reference.  Pain Clinic Pain Levels (0-5/10)  Pain Level Score  Description  No Pain 0   Mild pain 1 Nagging, annoying, but does not interfere with basic activities of daily living (ADL). Patients are able to eat, bathe, get dressed, toileting (being able to get on and off the toilet and perform personal hygiene functions), transfer (move in and out of bed or a chair without assistance), and maintain continence (able to control bladder and bowel  functions). Blood pressure and heart rate are unaffected. A normal heart rate for a healthy adult ranges from 60 to 100 bpm (beats per minute).   Mild to moderate pain  2 Noticeable and distracting. Impossible to hide from other people. More frequent flare-ups. Still possible to adapt and function close to normal. It can be very annoying and may have occasional stronger flare-ups. With discipline, patients may get used to it and adapt.   Moderate pain 3 Interferes significantly with activities of daily living (ADL). It becomes difficult to feed, bathe, get dressed, get on and off the toilet or to perform personal hygiene functions. Difficult to get in and out of bed or a chair without assistance. Very distracting. With effort, it can be ignored when deeply involved in activities.   Moderately severe pain 4 Impossible to ignore for more than a few minutes. With effort, patients may still be able to manage work or participate in some social activities. Very difficult to concentrate. Signs of autonomic nervous system discharge are evident: dilated pupils (mydriasis); mild sweating (diaphoresis); sleep interference. Heart rate becomes elevated (>115 bpm). Diastolic blood pressure (lower number) rises above 100 mmHg. Patients find relief in laying down and not moving.   Severe pain 5 Intense and extremely unpleasant. Associated with frowning face and frequent crying. Pain overwhelms the senses.  Ability to do any activity or maintain social relationships becomes significantly limited. Conversation becomes difficult. Pacing back and forth is common, as getting into a comfortable position is nearly impossible. Pain wakes you up from deep sleep. Physical signs will be obvious: pupillary dilation; increased sweating; goosebumps; brisk reflexes; cold, clammy hands and feet; nausea, vomiting or dry heaves; loss of appetite; significant sleep disturbance with inability to fall asleep or to remain asleep. When persistent, significant weight loss is observed due to the complete loss of appetite and sleep deprivation.  Blood pressure and heart rate becomes significantly elevated. Caution:  If elevated blood pressure triggers a pounding headache associated with blurred vision, then the patient should immediately seek attention at an urgent or emergency care unit, as these may be signs of an impending stroke.    Emergency Department Pain Levels (6-10/10)  Emergency Room Pain 6 Severely limiting. Requires emergency care and should not be seen or managed at an outpatient pain management facility. Communication becomes difficult and requires great effort. Assistance to reach the emergency department may be required. Facial flushing and profuse sweating along with potentially dangerous increases in heart rate and blood pressure will be evident.   Distressing pain 7 Self-care is very difficult. Assistance is required to transport, or use restroom. Assistance to reach the emergency department will be required. Tasks requiring coordination, such as bathing and getting dressed become very difficult.   Disabling pain 8 Self-care is no longer possible. At this level, pain is disabling. The individual is unable to do even the most "basic" activities such as walking, eating, bathing, dressing, transferring to a bed, or toileting. Fine motor skills are lost. It is difficult to think clearly.   Incapacitating pain 9 Pain becomes incapacitating. Thought processing is no longer possible. Difficult to remember your own name. Control of movement and coordination are lost.   The worst pain imaginable 10 At this level, most patients pass out from pain. When this level is reached, collapse of the autonomic nervous system occurs, leading to a sudden drop in blood pressure and heart rate. This in turn results in a temporary and dramatic drop in blood flow  to the brain, leading to a loss of consciousness. Fainting is one of the body's self defense mechanisms. Passing out puts the brain in a calmed state and causes it to shut down for a while, in order to begin the healing process.    Summary: 1. Refer to this  scale when providing [place] with your pain level. 2. Be accurate and careful when reporting your pain level. This will help with your care. 3. Over-reporting your pain level will lead to loss of credibility. 4. Even a level of 1/10 means that there is pain and will be treated at our facility. 5. High, inaccurate reporting will be documented as "Symptom Exaggeration", leading to loss of credibility and suspicions of possible secondary gains such as obtaining more narcotics, or wanting to appear disabled, for fraudulent reasons. 6. Only pain levels of 5 or below will be seen at our facility. 7. Pain levels of 6 and above will be sent to the Emergency Department and the appointment cancelled. ______________________________________________________________________________________________      BMI Assessment: Estimated body mass index is 40.89 kg/m as calculated from the following:   Height as of this encounter: 5\' 10"  (1.778 m).   Weight as of this encounter: 285 lb (129.3 kg).  BMI interpretation table: BMI level Category Range association with higher incidence of chronic pain  <18 kg/m2 Underweight   18.5-24.9 kg/m2 Ideal body weight   25-29.9 kg/m2 Overweight Increased incidence by 20%  30-34.9 kg/m2 Obese (Class I) Increased incidence by 68%  35-39.9 kg/m2 Severe obesity (Class II) Increased incidence by 136%  >40 kg/m2 Extreme obesity (Class III) Increased incidence by 254%   Patient's current BMI Ideal Body weight  Body mass index is 40.89 kg/m. Ideal body weight: 68.5 kg (151 lb 0.2 oz) Adjusted ideal body weight: 92.8 kg (204 lb 9.7 oz)   BMI Readings from Last 4 Encounters:  10/24/18 40.89 kg/m  08/25/18 38.74 kg/m  07/21/18 38.74 kg/m  05/16/18 40.18 kg/m   Wt Readings from Last 4 Encounters:  10/24/18 285 lb (129.3 kg)  08/25/18 270 lb (122.5 kg)  07/21/18 270 lb (122.5 kg)  05/16/18 280 lb (127 kg)

## 2018-10-24 NOTE — Progress Notes (Signed)
Nursing Pain Medication Assessment:  Safety precautions to be maintained throughout the outpatient stay will include: orient to surroundings, keep bed in low position, maintain call bell within reach at all times, provide assistance with transfer out of bed and ambulation.  Medication Inspection Compliance: Pill count conducted under aseptic conditions, in front of the patient. Neither the pills nor the bottle was removed from the patient's sight at any time. Once count was completed pills were immediately returned to the patient in their original bottle.  Medication: Oxycodone IR Pill/Patch Count: 14 of 90 pills remain Pill/Patch Appearance: Markings consistent with prescribed medication Bottle Appearance: Standard pharmacy container. Clearly labeled. Filled Date: 10 / 31 / 2019 Last Medication intake:  Today

## 2018-10-25 ENCOUNTER — Telehealth: Payer: Self-pay | Admitting: Nurse Practitioner

## 2018-10-25 MED ORDER — OXYCODONE-ACETAMINOPHEN 10-325 MG PO TABS: 0.5000 | ORAL_TABLET | Freq: Three times a day (TID) | ORAL | 0 refills | Status: DC | PRN

## 2018-10-25 MED ORDER — DICLOFENAC SODIUM 75 MG PO TBEC: 75.0000 mg | DELAYED_RELEASE_TABLET | Freq: Two times a day (BID) | ORAL | 1 refills | Status: DC

## 2018-10-25 MED ORDER — GABAPENTIN 300 MG PO CAPS: ORAL_CAPSULE | ORAL | 1 refills | Status: DC

## 2018-10-25 NOTE — Telephone Encounter (Signed)
Dis she complete her UDS?

## 2018-10-25 NOTE — Telephone Encounter (Signed)
Patient calling to check on meds refill. Says she called pharmacy and they did not have refill sent to them yet.

## 2018-10-26 NOTE — Telephone Encounter (Signed)
Yes, she came back and completed same day.

## 2018-10-29 LAB — TOXASSURE SELECT 13 (MW), URINE

## 2018-10-31 ENCOUNTER — Telehealth: Payer: Self-pay | Admitting: *Deleted

## 2018-10-31 NOTE — Telephone Encounter (Signed)
Patient notified that PA had been sent in for her medications.

## 2018-11-29 ENCOUNTER — Telehealth: Payer: Self-pay | Admitting: Nurse Practitioner

## 2018-11-29 NOTE — Telephone Encounter (Signed)
Patient called stating she is almost out of meds and pharmacy says she does not have another script. Please call patient and let her know status. Her med refill appt is scheduled 12-14-18

## 2018-11-29 NOTE — Telephone Encounter (Signed)
Patient to call us back. She needs an appointment to come in and discuss her medications/refill with [place] before 12/06/2018 and she is going to call back after she finds out about her transportation.

## 2018-12-06 ENCOUNTER — Encounter: Payer: Self-pay | Admitting: Nurse Practitioner

## 2018-12-06 ENCOUNTER — Other Ambulatory Visit: Payer: Self-pay

## 2018-12-06 ENCOUNTER — Ambulatory Visit: Payer: Medicaid Other | Attending: Nurse Practitioner | Admitting: Nurse Practitioner

## 2018-12-06 VITALS — BP 128/79 | HR 103 | Temp 98.2°F | Resp 16 | Ht 70.0 in | Wt 255.0 lb

## 2018-12-06 DIAGNOSIS — M25562 Pain in left knee: Secondary | ICD-10-CM | POA: Diagnosis present

## 2018-12-06 DIAGNOSIS — M5416 Radiculopathy, lumbar region: Secondary | ICD-10-CM | POA: Insufficient documentation

## 2018-12-06 DIAGNOSIS — G894 Chronic pain syndrome: Secondary | ICD-10-CM | POA: Insufficient documentation

## 2018-12-06 DIAGNOSIS — M25561 Pain in right knee: Secondary | ICD-10-CM | POA: Insufficient documentation

## 2018-12-06 DIAGNOSIS — G8929 Other chronic pain: Secondary | ICD-10-CM | POA: Insufficient documentation

## 2018-12-06 MED ORDER — GABAPENTIN 300 MG PO CAPS: ORAL_CAPSULE | ORAL | 1 refills | Status: DC

## 2018-12-06 MED ORDER — DICLOFENAC SODIUM 75 MG PO TBEC: 75.0000 mg | DELAYED_RELEASE_TABLET | Freq: Two times a day (BID) | ORAL | 1 refills | Status: DC

## 2018-12-06 MED ORDER — OXYCODONE-ACETAMINOPHEN 10-325 MG PO TABS: 1.0000 | ORAL_TABLET | Freq: Three times a day (TID) | ORAL | 0 refills | Status: DC | PRN

## 2018-12-06 NOTE — Progress Notes (Addendum)
Patient's Name: Terri Jennings  MRN: [id number]  Referring Provider: [facility] [place]*  DOB: 11-29-1956  PCP: [facility] [place]: 12/06/2018  Note by: Vevelyn Francois NP  Service setting: Ambulatory outpatient  Specialty: Interventional Pain Management  Location: [facility] (AMB) Pain Management Facility    Patient type: Established    Primary Reason(s) for Visit: Encounter for prescription drug management. (Level of risk: moderate)  CC: Knee Pain (bilateral) and Back Pain (lower)  HPI  Terri Jennings is a 63 y.o. year old, female patient, who comes today for a medication management evaluation. She has Chronic radicular low back pain; Chronic knee pain; Primary osteoarthritis of right knee; Morbid obesity ([place]); Chronic pain syndrome; Chronic pain of both knees (L>R); Long term current use of opiate analgesic; and Chronic musculoskeletal pain on their problem list. Her primarily concern today is the Knee Pain (bilateral) and Back Pain (lower)  Pain Assessment: Location: Right, Left Knee Radiating: does up and down leg bilateral Onset: More than a month ago Duration: Chronic pain Quality: Aching, Constant, Numbness, Discomfort(Popping noise) Severity: 4 /10 (subjective, self-reported pain score)  Note: Reported level is compatible with observation.                          Effect on ADL: prolonged walking, standing. Timing: Constant Modifying factors: "I am going to try knee injections", wear braces and wraps kne BP: 128/79  HR: (!) 103  Terri Jennings was last scheduled for an appointment on 11/29/2018 for medication management. During today's appointment we reviewed Terri Jennings's chronic pain status, as well as her outpatient medication regimen. Patient in today secondary to a mix up in her medication. She requested Oxycodone 11m 0.5 tabs TID. She was only given 51 tablets which was what her pharmacy and insurance would dispense.   The patient  reports no history  of drug use. Her body mass index is 36.59 kg/m.  Further details on both, my assessment(s), as well as the proposed treatment plan, please see below.  Controlled Substance Pharmacotherapy Assessment REMS (Risk Evaluation and Mitigation Strategy)  Analgesic:Oxycodone 5 mg 3 times daily MME/day:22.561mday. Terri SpeckingRN  12/06/2018  9:27 AM  Signed Nursing Pain Medication Assessment:  Safety precautions to be maintained throughout the outpatient stay will include: orient to surroundings, keep bed in low position, maintain call bell within reach at all times, provide assistance with transfer out of bed and ambulation.  Medication Inspection Compliance: Pill count conducted under aseptic conditions, in front of the patient. Neither the pills nor the bottle was removed from the patient's sight at any time. Once count was completed pills were immediately returned to the patient in their original bottle.  Medication: Oxycodone/APAP Pill/Patch Count: 0 of 51 pills remain Pill/Patch Appearance: Markings consistent with prescribed medication Bottle Appearance: Standard pharmacy container. Clearly labeled. Filled Date: 1215 4 / 2019 Last Medication intake:  Yesterday   Pharmacokinetics: Liberation and absorption (onset of action): WNL Distribution (time to peak effect): WNL Metabolism and excretion (duration of action): WNL         Pharmacodynamics: Desired effects: Analgesia: [name] >50% benefit. Functional ability: Patient reports that medication allows her to accomplish basic ADLs Clinically meaningful improvement in function (CMIF): Sustained CMIF goals met Perceived effectiveness: Described as relatively effective, allowing for increase in activities of daily living (ADL) Undesirable effects: Side-effects or Adverse reactions: None reported Monitoring: [place] PMP: Online review of the past 1223-month  period conducted. Compliant with practice rules and regulations Last UDS  on record: Summary  Date Value Ref Range Status  10/24/2018 FINAL  Final    Comment:    ==================================================================== TOXASSURE SELECT 13 (MW) ==================================================================== Test                             Result       Flag       Units Drug Present and Declared for Prescription Verification   Oxycodone                      1084         EXPECTED   ng/mg creat   Oxymorphone                    444          EXPECTED   ng/mg creat   Noroxycodone                   879          EXPECTED   ng/mg creat   Noroxymorphone                 60           EXPECTED   ng/mg creat    Sources of oxycodone are scheduled prescription medications.    Oxymorphone, noroxycodone, and noroxymorphone are expected    metabolites of oxycodone. Oxymorphone is also available as a    scheduled prescription medication. ==================================================================== Test                      Result    Flag   Units      Ref Range   Creatinine              148              mg/dL      >=20 ==================================================================== Declared Medications:  The flagging and interpretation on this report are based on the  following declared medications.  Unexpected results may arise from  inaccuracies in the declared medications.  **Note: The testing scope of this panel includes these medications:  Oxycodone  **Note: The testing scope of this panel does not include following  reported medications:  Amlodipine Besylate  Atropine  Diclofenac  Fenofibrate  Gabapentin  Lisinopril  Prednisolone  Rosuvastatin ==================================================================== For clinical consultation, please call [phone]. ====================================================================    UDS interpretation: Compliant          Medication Assessment Form: Reviewed. Patient indicates  being compliant with therapy Treatment compliance: Compliant Risk Assessment Profile: Aberrant behavior: See prior evaluations. None observed or detected today Comorbid factors increasing risk of overdose: See prior notes. No additional risks detected today Opioid risk tool (ORT) (Total Score): 0 Personal History of Substance Abuse (SUD-Substance use disorder):  Alcohol: Negative  Illegal Drugs: Negative  Rx Drugs: Negative  ORT Risk Level calculation: Low Risk Risk of substance use disorder (SUD): Low Opioid Risk Tool - 12/06/18 0823      Family History of Substance Abuse   Alcohol  Negative    Illegal Drugs  Negative    Rx Drugs  Negative      Personal History of Substance Abuse   Alcohol  Negative    Illegal Drugs  Negative    Rx Drugs  Negative      Age  Age between 16-45 years   No      Psychological Disease   Psychological Disease  Negative    OCD  Negative    Bipolar  Negative    Schizophrenia  Negative    Depression  Negative      Total Score   Opioid Risk Tool Scoring  0    Opioid Risk Interpretation  Low Risk      ORT Scoring interpretation table:  Score <3 = Low Risk for SUD  Score between 4-7 = Moderate Risk for SUD  Score >8 = High Risk for Opioid Abuse   Risk Mitigation Strategies:  Patient Counseling: Covered Patient-Prescriber Agreement (PPA): Present and active  Notification to other healthcare providers: Done  Pharmacologic Plan: No change in therapy, at this time.             Laboratory Chemistry  Inflammation Markers (CRP: Acute Phase) (ESR: Chronic Phase) No results found for: CRP, ESRSEDRATE, LATICACIDVEN                       Rheumatology Markers No results found for: RF, ANA, LABURIC, URICUR, LYMEIGGIGMAB, LYMEABIGMQN, HLAB27                      Renal Function Markers No results found for: BUN, CREATININE, BCR, GFRAA, GFRNONAA, LABVMA, EPIRU, ALPFXTK24OXB, NOREPRU, NOREPI24HUR, DOPARU, [id number]                            Hepatic Function Markers No results found for: AST, ALT, ALBUMIN, ALKPHOS, HCVAB, AMYLASE, LIPASE, AMMONIA                      Electrolytes No results found for: NA, K, CL, CALCIUM, MG, PHOS                      Neuropathy Markers No results found for: VITAMINB12, FOLATE, HGBA1C, HIV                      CNS Tests No results found for: COLORCSF, APPEARCSF, RBCCOUNTCSF, WBCCSF, POLYSCSF, LYMPHSCSF, EOSCSF, PROTEINCSF, GLUCCSF, JCVIRUS, CSFOLI, IGGCSF                      Bone Pathology Markers No results found for: VD25OH, TM196QI2LNL, GX2119ER7, EY8144YJ8, 25OHVITD1, 25OHVITD2, 25OHVITD3, TESTOFREE, TESTOSTERONE                       Coagulation Parameters No results found for: INR, LABPROT, APTT, PLT, DDIMER, LABHEMA, VITAMINK1                      Cardiovascular Markers No results found for: BNP, CKTOTAL, CKMB, TROPONINI, HGB, HCT                       CA Markers No results found for: CEA, CA125, LABCA2                      Note: Lab results reviewed.  Recent Diagnostic Imaging Results  MR KNEE LEFT WO CONTRAST CLINICAL DATA:  Left knee pain since May 2019.  EXAM: MRI OF THE LEFT KNEE WITHOUT CONTRAST  TECHNIQUE: Multiplanar, multisequence MR imaging of the knee was performed. No intravenous contrast was administered.  COMPARISON:  None.  FINDINGS: MENISCI  Medial meniscus:  Radial tear of the posterior horn of the medial meniscus with peripheral meniscal extrusion. Small undersurface tear of the body of the medial meniscus.  Lateral meniscus:  Intact.  LIGAMENTS  Cruciates:  Intact ACL and PCL.  Collaterals: Medial collateral ligament is intact. Lateral collateral ligament complex is intact.  CARTILAGE  Patellofemoral: Partial-thickness cartilage loss of the patellar apex with minimal subchondral reactive marrow changes. High-grade partial-thickness cartilage loss with areas of full-thickness cartilage loss of the trochlear groove with subchondral  reactive marrow changes.  Medial: High-grade partial-thickness cartilage loss with areas of full-thickness cartilage loss of the medial femoral condyle and medial tibial plateau. Osteochondral lesion involving the weight-bearing surface of the medial femoral condyle measuring 10 x 23 mm with cystic changes and surrounding marrow edema at the OCL-bone interface.  Lateral:  No chondral defect.  Joint: Small joint effusion. Normal Hoffa's fat. No plical thickening.  Popliteal Fossa:  Tiny Baker's cyst.  Intact popliteus tendon.  Extensor Mechanism: Intact quadriceps tendon. Intact patellar tendon. Intact medial patellar retinaculum. Intact lateral patellar retinaculum. Intact MPFL.  Bones:  No acute osseous abnormality.  No aggressive osseous lesion.  Other: No fluid collection or hematoma.  IMPRESSION: 1. Radial tear of the posterior horn of the medial meniscus with peripheral meniscal extrusion. Small undersurface tear of the body of the medial meniscus. 2. Partial-thickness cartilage loss of the patellar apex with minimal subchondral reactive marrow changes. High-grade partial-thickness cartilage loss with areas of full-thickness cartilage loss of the trochlear groove with subchondral reactive marrow changes. 3. High-grade partial-thickness cartilage loss with areas of full-thickness cartilage loss of the medial femoral condyle and medial tibial plateau. Osteochondral lesion involving the weight-bearing surface of the medial femoral condyle measuring 10 x 23 mm with cystic changes and surrounding marrow edema at the OCL-bone interface.  Electronically Signed   By: Kathreen Devoid   On: 08/09/2018 11:58  Complexity Note: Imaging results reviewed. Results shared with Ms. Mehl, using Layman's terms.                         Meds   Current Outpatient Medications:  .  AMLODIPINE BESYLATE PO, Take 10 mg by mouth. , Disp: , Rfl:  .  atropine 1 % ophthalmic solution, Place  1 drop into the right eye daily., Disp: , Rfl: 6 .  diclofenac (VOLTAREN) 75 MG EC tablet, Take 1 tablet (75 mg total) by mouth 2 (two) times daily., Disp: 60 tablet, Rfl: 1 .  fenofibrate (TRICOR) 145 MG tablet, Take 145 mg by mouth daily., Disp: , Rfl:  .  gabapentin (NEURONTIN) 300 MG capsule, 300 mg qday, 600 mg qhs, Disp: 90 capsule, Rfl: 1 .  lisinopril (PRINIVIL,ZESTRIL) 20 MG tablet, Take 25 mg by mouth daily. , Disp: , Rfl:  .  oxyCODONE-acetaminophen (PERCOCET) 10-325 MG tablet, Take 1 tablet by mouth every 8 (eight) hours as needed for pain., Disp: 90 tablet, Rfl: 0 .  prednisoLONE acetate (PRED FORTE) 1 % ophthalmic suspension, Place 1 drop into the right eye 3 (three) times daily., Disp: , Rfl: 3 .  rosuvastatin (CRESTOR) 20 MG tablet, Take 20 mg by mouth daily., Disp: , Rfl:   ROS  Constitutional: Denies any fever or chills Gastrointestinal: No reported hemesis, hematochezia, vomiting, or acute GI distress Musculoskeletal: Denies any acute onset joint swelling, redness, loss of ROM, or weakness Neurological: No reported episodes of acute onset apraxia, aphasia, dysarthria, agnosia, amnesia, paralysis, loss of coordination, or loss of consciousness  Allergies  Ms. Mermelstein is allergic to morphine and related; oxycontin [oxycodone hcl]; tramadol; and hydrocodone.  PFSH  Drug: Ms. Gunderman  reports no history of drug use. Alcohol:  reports no history of alcohol use. Tobacco:  reports that she has never smoked. She has never used smokeless tobacco. Medical:  has a past medical history of Arthritis, Dysrhythmia, Enlarged heart, and Hypertension. Surgical: Ms. Wygant  has a past surgical history that includes Knee surgery (Right, 2009) and Cataract extraction w/PHACO (Right, 03/18/2017). Family: family history includes Cancer in her maternal grandmother; Stroke in her father.  Constitutional Exam  General appearance: Well nourished, well developed, and well hydrated. In no apparent  acute distress Vitals:   12/06/18 0816  BP: 128/79  Pulse: (!) 103  Resp: 16  Temp: 98.2 F (36.8 C)  SpO2: 100%  Weight: 255 lb (115.7 kg)  Height: _0  (1.778 m)  Psych/Mental status: Alert, oriented x 3 (person, place, & time)       Eyes: PERLA Respiratory: No evidence of acute respiratory distress  Lumbar Spine Area Exam  Skin & Axial Inspection: No masses, redness, or swelling Alignment: Symmetrical Functional ROM: Unrestricted ROM       Stability: No instability detected Muscle Tone/Strength: Functionally intact. No obvious neuro-muscular anomalies detected. Sensory (Neurological): Unimpaired Palpation: No palpable anomalies       Provocative Tests: Hyperextension/rotation test: deferred today       Lumbar quadrant test (Kemp's test): deferred today       Lateral bending test: deferred today        Gait & Posture Assessment  Ambulation: Unassisted Gait: Relatively normal for age and body habitus Posture: WNL   Lower Extremity Exam    Side: Right lower extremity  Side: Left lower extremity  Stability: No instability observed          Stability: No instability observed          Skin & Extremity Inspection: Skin color, temperature, and hair growth are WNL. No peripheral edema or cyanosis. No masses, redness, swelling, asymmetry, or associated skin lesions. No contractures.  Skin & Extremity Inspection: Skin color, temperature, and hair growth are WNL. No peripheral edema or cyanosis. No masses, redness, swelling, asymmetry, or associated skin lesions. No contractures.  Functional ROM: Adequate ROM                  Functional ROM: Adequate ROM                  Muscle Tone/Strength: Functionally intact. No obvious neuro-muscular anomalies detected.  Muscle Tone/Strength: Functionally intact. No obvious neuro-muscular anomalies detected.  Sensory (Neurological): Unimpaired        Sensory (Neurological): Unimpaired            Palpation: No palpable anomalies  Palpation: No  palpable anomalies   Assessment  Primary Diagnosis & Pertinent Problem List: The primary encounter diagnosis was Chronic pain of right knee. Diagnoses of Chronic pain of both knees (L>R), Chronic radicular low back pain, and Chronic pain syndrome were also pertinent to this visit.  Status Diagnosis  Persistent Persistent Persistent 1. Chronic pain of right knee   2. Chronic pain of both knees (L>R)   3. Chronic radicular low back pain   4. Chronic pain syndrome     Problems updated and reviewed during this visit: No problems updated. Plan of Care  Pharmacotherapy (Medications Ordered): Meds ordered this encounter  Medications  . oxyCODONE-acetaminophen (PERCOCET) 10-325 MG tablet  Sig: Take 1 tablet by mouth every 8 (eight) hours as needed for pain.    Dispense:  90 tablet    Refill:  0    Do not place this medication, or any other prescription from our practice, on "Automatic Refill". Patient may have prescription filled one day early if pharmacy is closed on scheduled refill date.    Order Specific Question:   Supervising Provider    Answer:   Gillis Santa [[id number]]  . diclofenac (VOLTAREN) 75 MG EC tablet    Sig: Take 1 tablet (75 mg total) by mouth 2 (two) times daily.    Dispense:  60 tablet    Refill:  1    Order Specific Question:   Supervising Provider    Answer:   Milinda Pointer [phone]  . gabapentin (NEURONTIN) 300 MG capsule    Sig: 300 mg qday, 600 mg qhs    Dispense:  90 capsule    Refill:  1    Order Specific Question:   Supervising Provider    AnswerMilinda Pointer [phone]   New Prescriptions   No medications on file   Medications administered today: Genella Rife had no medications administered during this visit. Lab-work, procedure(s), and/or referral(s): No orders of the defined types were placed in this encounter.  Imaging and/or referral(s): None  Interventional therapies: Planned, scheduled, and/or pending:   Hyalgan  series in left knee.    Provider-requested follow-up: Return in about 2 months (around 02/04/2019) for MedMgmt.  Future Appointments  Date Time Provider Echo  12/12/2018  9:00 AM Gillis Santa, MD [facility] None  01/30/2019  9:00 AM Vevelyn Francois, NP [facility] None   Primary Care Physician: [facility] [place] Location: [facility] Outpatient Pain Management Facility Note by: Vevelyn Francois NP Date: 12/06/2018; Time: 9:31 AM  Pain Score Disclaimer: We use the NRS-11 scale. This is a self-reported, subjective measurement of pain severity with only modest accuracy. It is used primarily to identify changes within a particular patient. It must be understood that outpatient pain scales are significantly less accurate that those used for research, where they can be applied under ideal controlled circumstances with minimal exposure to variables. In reality, the score is likely to be a combination of pain intensity and pain affect, where pain affect describes the degree of emotional arousal or changes in action readiness caused by the sensory experience of pain. Factors such as social and work situation, setting, emotional state, anxiety levels, expectation, and prior pain experience may influence pain perception and show large inter-individual differences that may also be affected by time variables.  Patient instructions provided during this appointment: Patient Instructions   ____________________________________________________________________________________________  Medication Rules  Purpose: To inform patients, and their family members, of our rules and regulations.  Applies to: All patients receiving prescriptions (written or electronic).  Pharmacy of record: Pharmacy where electronic prescriptions will be sent. If written prescriptions are taken to a different pharmacy, please inform the nursing staff. The pharmacy listed in the electronic medical record should be the one  where you would like electronic prescriptions to be sent.  Electronic prescriptions: In compliance with the [place] (STOP) Act of 2017 (Session Lanny Cramp [phone]), effective November 30, 2018, all controlled substances must be electronically prescribed. Calling prescriptions to the pharmacy will cease to exist.  Prescription refills: Only during scheduled appointments. Applies to all prescriptions.  NOTE: The following applies primarily to controlled substances (Opioid* Pain Medications).  Patient's responsibilities: 1. Pain Pills: Bring all pain pills to every appointment (except for procedure appointments). 2. Pill Bottles: Bring pills in original pharmacy bottle. Always bring the newest bottle. Bring bottle, even if empty. 3. Medication refills: You are responsible for knowing and keeping track of what medications you take and those you need refilled. The day before your appointment: write a list of all prescriptions that need to be refilled. The day of the appointment: give the list to the admitting nurse. Prescriptions will be written only during appointments. If you forget a medication: it will not be "Called in", "Faxed", or "electronically sent". You will need to get another appointment to get these prescribed. No early refills. Do not call asking to have your prescription filled early. 4. Prescription Accuracy: You are responsible for carefully inspecting your prescriptions before leaving our office. Have the discharge nurse carefully go over each prescription with you, before taking them home. Make sure that your name is accurately spelled, that your address is correct. Check the name and dose of your medication to make sure it is accurate. Check the number of pills, and the written instructions to make sure they are clear and accurate. Make sure that you are given enough medication to last until your next medication refill appointment. 5. Taking  Medication: Take medication as prescribed. When it comes to controlled substances, taking less pills or less frequently than prescribed is permitted and encouraged. Never take more pills than instructed. Never take medication more frequently than prescribed.  6. Inform other Doctors: Always inform, all of your healthcare providers, of all the medications you take. 7. Pain Medication from other Providers: You are not allowed to accept any additional pain medication from any other Doctor or Healthcare provider. There are two exceptions to this rule. (see below) In the event that you require additional pain medication, you are responsible for notifying us, as stated below. 8. Medication Agreement: You are responsible for carefully reading and following our Medication Agreement. This must be signed before receiving any prescriptions from our practice. Safely store a copy of your signed Agreement. Violations to the Agreement will result in no further prescriptions. (Additional copies of our Medication Agreement are available upon request.) 9. Laws, Rules, & Regulations: All patients are expected to follow all Federal and [employer], Rules, [employer]. Ignorance of the Laws does not constitute a valid excuse. The use of any illegal substances is prohibited. 10. Adopted CDC guidelines & recommendations: Target dosing levels will be at or below 60 MME/day. Use of benzodiazepines** is not recommended.  Exceptions: There are only two exceptions to the rule of not receiving pain medications from other Healthcare Providers. 1. Exception #1 (Emergencies): In the event of an emergency (i.e.: accident requiring emergency care), you are allowed to receive additional pain medication. However, you are responsible for: As soon as you are able, call our office [phone], at any time of the day or night, and leave a message stating your name, the date and nature of the emergency, and the name and dose of the  medication prescribed. In the event that your call is answered by a member of our staff, make sure to document and save the date, time, and the name of the person that took your information.  2. Exception #2 (Planned Surgery): In the event that you are scheduled by another doctor or dentist to have any type of surgery or procedure, you are allowed (for a period no longer than 30 days),  to receive additional pain medication, for the acute post-op pain. However, in this case, you are responsible for picking up a copy of our "Post-op Pain Management for Surgeons" handout, and giving it to your surgeon or dentist. This document is available at our office, and does not require an appointment to obtain it. Simply go to our office during business hours (Monday-Thursday from 8:00 AM to 4:00 PM) (Friday 8:00 AM to 12:00 Noon) or if you have a scheduled appointment with [place], prior to your surgery, and ask for it by name. In addition, you will need to provide [place] with your name, name of your surgeon, type of surgery, and date of procedure or surgery.  *Opioid medications include: morphine, codeine, oxycodone, oxymorphone, hydrocodone, hydromorphone, meperidine, tramadol, tapentadol, buprenorphine, fentanyl, methadone. **Benzodiazepine medications include: diazepam (Valium), alprazolam (Xanax), clonazepam (Klonopine), lorazepam (Ativan), clorazepate (Tranxene), chlordiazepoxide (Librium), estazolam (Prosom), oxazepam (Serax), temazepam (Restoril), triazolam (Halcion) (Last updated: 01/27/2018) ____________________________________________________________________________________________    BMI Assessment: Estimated body mass index is 36.59 kg/m as calculated from the following:   Height as of this encounter: _0  (1.778 m).   Weight as of this encounter: 255 lb (115.7 kg).  BMI interpretation table: BMI level Category Range association with higher incidence of chronic pain  <18 kg/m2 Underweight   18.5-24.9  kg/m2 Ideal body weight   25-29.9 kg/m2 Overweight Increased incidence by 20%  30-34.9 kg/m2 Obese (Class I) Increased incidence by 68%  35-39.9 kg/m2 Severe obesity (Class II) Increased incidence by 136%  >40 kg/m2 Extreme obesity (Class III) Increased incidence by 254%   Patient's current BMI Ideal Body weight  Body mass index is 36.59 kg/m. Ideal body weight: 68.5 kg (151 lb 0.2 oz) Adjusted ideal body weight: 87.4 kg (192 lb 9.7 oz)   BMI Readings from Last 4 Encounters:  12/06/18 36.59 kg/m  10/24/18 40.89 kg/m  08/25/18 38.74 kg/m  07/21/18 38.74 kg/m   Wt Readings from Last 4 Encounters:  12/06/18 255 lb (115.7 kg)  10/24/18 285 lb (129.3 kg)  08/25/18 270 lb (122.5 kg)  07/21/18 270 lb (122.5 kg)

## 2018-12-06 NOTE — Patient Instructions (Addendum)
____________________________________________________________________________________________  Medication Rules  Purpose: To inform patients, and their family members, of our rules and regulations.  Applies to: All patients receiving prescriptions (written or electronic).  Pharmacy of record: Pharmacy where electronic prescriptions will be sent. If written prescriptions are taken to a different pharmacy, please inform the nursing staff. The pharmacy listed in the electronic medical record should be the one where you would like electronic prescriptions to be sent.  Electronic prescriptions: In compliance with the [place] Strengthen Opioid Misuse Prevention (STOP) Act of 2017 (Session Law 2017-74/H243), effective November 30, 2018, all controlled substances must be electronically prescribed. Calling prescriptions to the pharmacy will cease to exist.  Prescription refills: Only during scheduled appointments. Applies to all prescriptions.  NOTE: The following applies primarily to controlled substances (Opioid* Pain Medications).   Patient's responsibilities: 1. Pain Pills: Bring all pain pills to every appointment (except for procedure appointments). 2. Pill Bottles: Bring pills in original pharmacy bottle. Always bring the newest bottle. Bring bottle, even if empty. 3. Medication refills: You are responsible for knowing and keeping track of what medications you take and those you need refilled. The day before your appointment: write a list of all prescriptions that need to be refilled. The day of the appointment: give the list to the admitting nurse. Prescriptions will be written only during appointments. If you forget a medication: it will not be "Called in", "Faxed", or "electronically sent". You will need to get another appointment to get these prescribed. No early refills. Do not call asking to have your prescription filled early. 4. Prescription Accuracy: You are responsible for  carefully inspecting your prescriptions before leaving our office. Have the discharge nurse carefully go over each prescription with you, before taking them home. Make sure that your name is accurately spelled, that your address is correct. Check the name and dose of your medication to make sure it is accurate. Check the number of pills, and the written instructions to make sure they are clear and accurate. Make sure that you are given enough medication to last until your next medication refill appointment. 5. Taking Medication: Take medication as prescribed. When it comes to controlled substances, taking less pills or less frequently than prescribed is permitted and encouraged. Never take more pills than instructed. Never take medication more frequently than prescribed.  6. Inform other Doctors: Always inform, all of your healthcare providers, of all the medications you take. 7. Pain Medication from other Providers: You are not allowed to accept any additional pain medication from any other Doctor or Healthcare provider. There are two exceptions to this rule. (see below) In the event that you require additional pain medication, you are responsible for notifying us, as stated below. 8. Medication Agreement: You are responsible for carefully reading and following our Medication Agreement. This must be signed before receiving any prescriptions from our practice. Safely store a copy of your signed Agreement. Violations to the Agreement will result in no further prescriptions. (Additional copies of our Medication Agreement are available upon request.) 9. Laws, Rules, & Regulations: All patients are expected to follow all Federal and State Laws, Statutes, Rules, & Regulations. Ignorance of the Laws does not constitute a valid excuse. The use of any illegal substances is prohibited. 10. Adopted CDC guidelines & recommendations: Target dosing levels will be at or below 60 MME/day. Use of benzodiazepines** is not  recommended.  Exceptions: There are only two exceptions to the rule of not receiving pain medications from other Healthcare Providers. 1.   Exception #1 (Emergencies): In the event of an emergency (i.e.: accident requiring emergency care), you are allowed to receive additional pain medication. However, you are responsible for: As soon as you are able, call our office [phone], at any time of the day or night, and leave a message stating your name, the date and nature of the emergency, and the name and dose of the medication prescribed. In the event that your call is answered by a member of our staff, make sure to document and save the date, time, and the name of the person that took your information.  2. Exception #2 (Planned Surgery): In the event that you are scheduled by another doctor or dentist to have any type of surgery or procedure, you are allowed (for a period no longer than 30 days), to receive additional pain medication, for the acute post-op pain. However, in this case, you are responsible for picking up a copy of our "Post-op Pain Management for Surgeons" handout, and giving it to your surgeon or dentist. This document is available at our office, and does not require an appointment to obtain it. Simply go to our office during business hours (Monday-Thursday from 8:00 AM to 4:00 PM) (Friday 8:00 AM to 12:00 Noon) or if you have a scheduled appointment with [place], prior to your surgery, and ask for it by name. In addition, you will need to provide [place] with your name, name of your surgeon, type of surgery, and date of procedure or surgery.  *Opioid medications include: morphine, codeine, oxycodone, oxymorphone, hydrocodone, hydromorphone, meperidine, tramadol, tapentadol, buprenorphine, fentanyl, methadone. **Benzodiazepine medications include: diazepam (Valium), alprazolam (Xanax), clonazepam (Klonopine), lorazepam (Ativan), clorazepate (Tranxene), chlordiazepoxide (Librium), estazolam (Prosom),  oxazepam (Serax), temazepam (Restoril), triazolam (Halcion) (Last updated: 01/27/2018) ____________________________________________________________________________________________    BMI Assessment: Estimated body mass index is 36.59 kg/m as calculated from the following:   Height as of this encounter: 5\' 10"  (1.778 m).   Weight as of this encounter: 255 lb (115.7 kg).  BMI interpretation table: BMI level Category Range association with higher incidence of chronic pain  <18 kg/m2 Underweight   18.5-24.9 kg/m2 Ideal body weight   25-29.9 kg/m2 Overweight Increased incidence by 20%  30-34.9 kg/m2 Obese (Class I) Increased incidence by 68%  35-39.9 kg/m2 Severe obesity (Class II) Increased incidence by 136%  >40 kg/m2 Extreme obesity (Class III) Increased incidence by 254%   Patient's current BMI Ideal Body weight  Body mass index is 36.59 kg/m. Ideal body weight: 68.5 kg (151 lb 0.2 oz) Adjusted ideal body weight: 87.4 kg (192 lb 9.7 oz)   BMI Readings from Last 4 Encounters:  12/06/18 36.59 kg/m  10/24/18 40.89 kg/m  08/25/18 38.74 kg/m  07/21/18 38.74 kg/m   Wt Readings from Last 4 Encounters:  12/06/18 255 lb (115.7 kg)  10/24/18 285 lb (129.3 kg)  08/25/18 270 lb (122.5 kg)  07/21/18 270 lb (122.5 kg)

## 2018-12-06 NOTE — Progress Notes (Signed)
Nursing Pain Medication Assessment:  Safety precautions to be maintained throughout the outpatient stay will include: orient to surroundings, keep bed in low position, maintain call bell within reach at all times, provide assistance with transfer out of bed and ambulation.  Medication Inspection Compliance: Pill count conducted under aseptic conditions, in front of the patient. Neither the pills nor the bottle was removed from the patient's sight at any time. Once count was completed pills were immediately returned to the patient in their original bottle.  Medication: Oxycodone/APAP Pill/Patch Count: 0 of 51 pills remain Pill/Patch Appearance: Markings consistent with prescribed medication Bottle Appearance: Standard pharmacy container. Clearly labeled. Filled Date: 34 / 4 / 2019 Last Medication intake:  Yesterday

## 2018-12-06 NOTE — Progress Notes (Signed)
CE

## 2018-12-12 ENCOUNTER — Other Ambulatory Visit: Payer: Self-pay

## 2018-12-12 ENCOUNTER — Encounter: Payer: Self-pay | Admitting: Student in an Organized Health Care Education/Training Program

## 2018-12-12 ENCOUNTER — Ambulatory Visit
Payer: Medicaid Other | Attending: Student in an Organized Health Care Education/Training Program | Admitting: Student in an Organized Health Care Education/Training Program

## 2018-12-12 VITALS — BP 150/84 | HR 89 | Temp 97.4°F | Resp 18 | Ht 70.0 in | Wt 260.0 lb

## 2018-12-12 DIAGNOSIS — M25562 Pain in left knee: Secondary | ICD-10-CM | POA: Diagnosis present

## 2018-12-12 DIAGNOSIS — G8929 Other chronic pain: Secondary | ICD-10-CM | POA: Diagnosis present

## 2018-12-12 MED ORDER — SODIUM HYALURONATE (VISCOSUP) 20 MG/2ML IX SOSY
2.0000 mL | PREFILLED_SYRINGE | Freq: Once | INTRA_ARTICULAR | Status: AC
  Administered 2018-12-12: 2 mL via INTRA_ARTICULAR

## 2018-12-12 MED ORDER — LIDOCAINE HCL 2 % IJ SOLN
20.0000 mL | Freq: Once | INTRAMUSCULAR | Status: AC
  Administered 2018-12-12: 400 mg
  Filled 2018-12-12: qty 20

## 2018-12-12 NOTE — Progress Notes (Signed)
Safety precautions to be maintained throughout the outpatient stay will include: orient to surroundings, keep bed in low position, maintain call bell within reach at all times, provide assistance with transfer out of bed and ambulation.  

## 2018-12-12 NOTE — Progress Notes (Signed)
Patient's Name: Terri Jennings  MRN: [id number]  Referring Provider: [facility]  DOB: 1956-09-30  [facility]  DOS: 12/12/2018  Note by: Edward JollyBilal Daviel Allegretto, MD  Service setting: Ambulatory outpatient  Specialty: Interventional Pain Management  Patient type: Established  Location: [facility] (AMB) Pain Management Facility  Visit type: Interventional Procedure   Primary Reason for Visit: Interventional Pain Management Treatment. CC: Knee Pain (bilateral, left is worse)  Procedure:          Anesthesia, Analgesia, Anxiolysis:  Type: Diagnostic Intra-Articular Hyalgan Knee Injection          Region: Medial infrapatellar Knee Region Level: Knee Joint Laterality: Left knee  Type: Local Anesthesia Indication(s): Analgesia         Local Anesthetic: Lidocaine 1-2% Route: Infiltration ([place]/IM) IV Access: Declined Sedation: Declined   Position: Sitting   Indications: 1. Chronic pain of left knee    Pain Score: Pre-procedure: 4 /10 Post-procedure: 4 /10  Pre-op Assessment:  Ms. Terri Jennings is a 63 y.o. (year old), female patient, seen today for interventional treatment. She  has a past surgical history that includes Knee surgery (Right, 2009) and Cataract extraction w/PHACO (Right, 03/18/2017). Ms. Terri Jennings has a current medication list which includes the following prescription(s): amlodipine besylate, atropine, diclofenac, fenofibrate, gabapentin, lisinopril, oxycodone-acetaminophen, prednisolone acetate, and rosuvastatin. Her primarily concern today is the Knee Pain (bilateral, left is worse)  Initial Vital Signs:  Pulse/HCG Rate: 84  Temp: 98.1 F (36.7 C) Resp: 18 BP: (!) 148/91 SpO2: 99 %  BMI: Estimated body mass index is 37.31 kg/m as calculated from the following:   Height as of this encounter: 5\' 10"  (1.778 m).   Weight as of this encounter: 260 lb (117.9 kg).  Risk Assessment: Allergies: Reviewed. She is allergic to morphine and related;  oxycontin [oxycodone hcl]; tramadol; and hydrocodone.  Allergy Precautions: None required Coagulopathies: Reviewed. None identified.  Blood-thinner therapy: None at this time Active Infection(s): Reviewed. None identified. Ms. Terri Jennings is afebrile  Site Confirmation: Ms. Terri Jennings was asked to confirm the procedure and laterality before marking the site Procedure checklist: Completed Consent: Before the procedure and under the influence of no sedative(s), amnesic(s), or anxiolytics, the patient was informed of the treatment options, risks and possible complications. To fulfill our ethical and legal obligations, as recommended by the American Medical Association's Code of Ethics, I have informed the patient of my clinical impression; the nature and purpose of the treatment or procedure; the risks, benefits, and possible complications of the intervention; the alternatives, including doing nothing; the risk(s) and benefit(s) of the alternative treatment(s) or procedure(s); and the risk(s) and benefit(s) of doing nothing. The patient was provided information about the general risks and possible complications associated with the procedure. These may include, but are not limited to: failure to achieve desired goals, infection, bleeding, organ or nerve damage, allergic reactions, paralysis, and death. In addition, the patient was informed of those risks and complications associated to the procedure, such as failure to decrease pain; infection; bleeding; organ or nerve damage with subsequent damage to sensory, motor, and/or autonomic systems, resulting in permanent pain, numbness, and/or weakness of one or several areas of the body; allergic reactions; (i.e.: anaphylactic reaction); and/or death. Furthermore, the patient was informed of those risks and complications associated with the medications. These include, but are not limited to: allergic reactions (i.e.: anaphylactic or anaphylactoid reaction(s)); adrenal  axis suppression; blood sugar elevation that in diabetics may result in ketoacidosis or comma; water retention that  in patients with history of congestive heart failure may result in shortness of breath, pulmonary edema, and decompensation with resultant heart failure; weight gain; swelling or edema; medication-induced neural toxicity; particulate matter embolism and blood vessel occlusion with resultant organ, and/or nervous system infarction; and/or aseptic necrosis of one or more joints. Finally, the patient was informed that Medicine is not an exact science; therefore, there is also the possibility of unforeseen or unpredictable risks and/or possible complications that may result in a catastrophic outcome. The patient indicated having understood very clearly. We have given the patient no guarantees and we have made no promises. Enough time was given to the patient to ask questions, all of which were answered to the patient's satisfaction. Ms. Terri Jennings has indicated that she wanted to continue with the procedure. Attestation: I, the ordering provider, attest that I have discussed with the patient the benefits, risks, side-effects, alternatives, likelihood of achieving goals, and potential problems during recovery for the procedure that I have provided informed consent. Date  Time: 12/12/2018  8:42 AM  Pre-Procedure Preparation:  Monitoring: As per clinic protocol. Respiration, ETCO2, SpO2, BP, heart rate and rhythm monitor placed and checked for adequate function Safety Precautions: Patient was assessed for positional comfort and pressure points before starting the procedure. Time-out: I initiated and conducted the "Time-out" before starting the procedure, as per protocol. The patient was asked to participate by confirming the accuracy of the "Time Out" information. Verification of the correct person, site, and procedure were performed and confirmed by me, the nursing staff, and the patient. "Time-out"  conducted as per Joint Commission's Universal Protocol (UP.01.01.01). Time: 0910  Description of Procedure:          Target Area: Knee Joint Approach: Just above the Medial tibial plateau, lateral to the infrapatellar tendon. Area Prepped: Entire knee area, from the mid-thigh to the mid-shin. Prepping solution: ChloraPrep (2% chlorhexidine gluconate and 70% isopropyl alcohol) Safety Precautions: Aspiration looking for blood return was conducted prior to all injections. At no point did we inject any substances, as a needle was being advanced. No attempts were made at seeking any paresthesias. Safe injection practices and needle disposal techniques used. Medications properly checked for expiration dates. SDV (single dose vial) medications used. Description of the Procedure: Protocol guidelines were followed. The patient was placed in position over the fluoroscopy table. The target area was identified and the area prepped in the usual manner. Skin & deeper tissues infiltrated with local anesthetic. Appropriate amount of time allowed to pass for local anesthetics to take effect. The procedure needles were then advanced to the target area. Proper needle placement secured. Negative aspiration confirmed. Solution injected in intermittent fashion, asking for systemic symptoms every 0.5cc of injectate. The needles were then removed and the area cleansed, making sure to leave some of the prepping solution back to take advantage of its long term bactericidal properties. Vitals:   12/12/18 0847 12/12/18 0900  BP: (!) 148/91 (!) 150/84  Pulse: 84 89  Resp: 18 18  Temp: 98.1 F (36.7 C) (!) 97.4 F (36.3 C)  TempSrc: Oral Oral  SpO2: 99% 99%  Weight: 260 lb (117.9 kg)   Height: 5\' 10"  (1.778 m)     Start Time: 0911 hrs. End Time: 0914 hrs. Materials:  Needle(s) Type: Regular needle Gauge: 25G Length: 1.5-in Medication(s): Please see orders for medications and dosing details.  Imaging Guidance:           Type of Imaging Technique: None used Indication(s): N/A Exposure  Time: No patient exposure Contrast: None used. Fluoroscopic Guidance: N/A Ultrasound Guidance: N/A Interpretation: N/A  Antibiotic Prophylaxis:   Anti-infectives (From admission, onward)   None     Indication(s): None identified  Post-operative Assessment:  Post-procedure Vital Signs:  Pulse/HCG Rate: 89  Temp: (!) 97.4 F (36.3 C) Resp: 18 BP: (!) 150/84 SpO2: 99 %  EBL: None  Complications: No immediate post-treatment complications observed by team, or reported by patient.  Note: The patient tolerated the entire procedure well. A repeat set of vitals were taken after the procedure and the patient was kept under observation following institutional policy, for this type of procedure. Post-procedural neurological assessment was performed, showing return to baseline, prior to discharge. The patient was provided with post-procedure discharge instructions, including a section on how to identify potential problems. Should any problems arise concerning this procedure, the patient was given instructions to immediately contact us, at any time, without hesitation. In any case, we plan to contact the patient by telephone for a follow-up status report regarding this interventional procedure.  Comments:  No additional relevant information.  Plan of Care   Imaging Orders  No imaging studies ordered today  Return for Hyalgan knee injection #2 in 4 weeks  Procedure Orders     KNEE INJECTION  Medications ordered for procedure: Meds ordered this encounter  Medications  . Sodium Hyaluronate SOSY 2 mL  . lidocaine (XYLOCAINE) 2 % (with pres) injection 400 mg   Medications administered: We administered Sodium Hyaluronate and lidocaine.  See the medical record for exact dosing, route, and time of administration.  Disposition: Discharge home  Discharge Date & Time: 12/12/2018; 0920 hrs.   Physician-requested  Follow-up: Return in about 4 weeks (around 01/09/2019) for Procedure.  Future Appointments  Date Time Provider Department Center  01/09/2019  8:45 AM Edward Jolly, MD [facility] None  01/30/2019  9:00 AM Barbette Merino, NP [facility] None   Primary Care Physician: [facility] Location: [facility] Outpatient [facility] Management Facility Note by: Edward Jolly, MD Date: 12/12/2018; Time: 9:51 AM  Disclaimer:  Medicine is not an exact science. The only guarantee in medicine is that nothing is guaranteed. It is important to note that the decision to proceed with this intervention was based on the information collected from the patient. The Data and conclusions were drawn from the patient's questionnaire, the interview, and the physical examination. Because the information was provided in large part by the patient, it cannot be guaranteed that it has not been purposely or unconsciously manipulated. Every effort has been made to obtain as much relevant data as possible for this evaluation. It is important to note that the conclusions that lead to this procedure are derived in large part from the available data. Always take into account that the treatment will also be dependent on availability of resources and existing treatment guidelines, considered by other Pain Management Practitioners as being common knowledge and practice, at the time of the intervention. For Medico-Legal purposes, it is also important to point out that variation in procedural techniques and pharmacological choices are the acceptable norm. The indications, contraindications, technique, and results of the above procedure should only be interpreted and judged by a Board-Certified Interventional Pain Specialist with extensive familiarity and expertise in the same exact procedure and technique.

## 2018-12-13 ENCOUNTER — Telehealth: Payer: Self-pay

## 2018-12-13 NOTE — Telephone Encounter (Signed)
Post procedure phone call.  Patient states she is doing well.  

## 2018-12-14 ENCOUNTER — Encounter: Payer: Medicaid Other | Admitting: Nurse Practitioner

## 2018-12-19 ENCOUNTER — Ambulatory Visit: Payer: Medicaid Other | Admitting: Student in an Organized Health Care Education/Training Program

## 2019-01-09 ENCOUNTER — Other Ambulatory Visit: Payer: Self-pay

## 2019-01-09 ENCOUNTER — Ambulatory Visit: Payer: Medicaid Other | Admitting: Student in an Organized Health Care Education/Training Program

## 2019-01-09 ENCOUNTER — Ambulatory Visit
Payer: Medicaid Other | Attending: Student in an Organized Health Care Education/Training Program | Admitting: Student in an Organized Health Care Education/Training Program

## 2019-01-09 ENCOUNTER — Encounter: Payer: Self-pay | Admitting: Student in an Organized Health Care Education/Training Program

## 2019-01-09 VITALS — BP 130/72 | HR 88 | Temp 98.5°F | Resp 16 | Ht 70.0 in | Wt 260.0 lb

## 2019-01-09 DIAGNOSIS — M25562 Pain in left knee: Secondary | ICD-10-CM | POA: Diagnosis not present

## 2019-01-09 DIAGNOSIS — G8929 Other chronic pain: Secondary | ICD-10-CM | POA: Insufficient documentation

## 2019-01-09 MED ORDER — LIDOCAINE HCL 2 % IJ SOLN
20.0000 mL | Freq: Once | INTRAMUSCULAR | Status: AC
  Administered 2019-01-09: 400 mg
  Filled 2019-01-09: qty 20

## 2019-01-09 MED ORDER — SODIUM HYALURONATE (VISCOSUP) 20 MG/2ML IX SOSY
2.0000 mL | PREFILLED_SYRINGE | Freq: Once | INTRA_ARTICULAR | Status: AC
  Administered 2019-01-09: 2 mL via INTRA_ARTICULAR

## 2019-01-09 NOTE — Progress Notes (Signed)
Safety precautions to be maintained throughout the outpatient stay will include: orient to surroundings, keep bed in low position, maintain call bell within reach at all times, provide assistance with transfer out of bed and ambulation.  

## 2019-01-09 NOTE — Progress Notes (Signed)
Patient's Name: Terri Jennings  MRN: [id number]  Referring Provider: [facility]  DOB: [birth year]  PCP: [facility]  DOS: 01/09/2019  Note by: Edward Jolly, MD  Service setting: Ambulatory outpatient  Specialty: Interventional Pain Management  Patient type: Established  Location: [facility] (AMB) Pain Management Facility  Visit type: Interventional Procedure   Primary Reason for Visit: Interventional Pain Management Treatment. CC: Knee Pain (bilateral, left is worse)  Procedure:          Anesthesia, Analgesia, Anxiolysis:  Type: Therapeutic Intra-Articular Hyalgan Knee Injection #2  Region: Medial infrapatellar Knee Region Level: Knee Joint Laterality: Left knee  Type: Local Anesthesia Indication(s): Analgesia         Local Anesthetic: Lidocaine 1-2% Route: Infiltration ([place]/IM) IV Access: Declined Sedation: Declined   Position: Sitting   Indications: 1. Chronic pain of left knee    Pain Score: Pre-procedure: 5 /10 Post-procedure: 5 /10  Patient endorses benefit after injection #1.  Endorses reduction in pain and also improvement walking.  States that she has return of pain approximately 1 week ago.  Will repeat intra-articular left Hyalgan injection #2 today.  Pre-op Assessment:  Terri Jennings is a 63 y.o. (year old), female patient, seen today for interventional treatment. She  has a past surgical history that includes Knee surgery (Right, 2009) and Cataract extraction w/PHACO (Right, 03/18/2017). Terri Jennings has a current medication list which includes the following prescription(s): amlodipine besylate, atropine, diclofenac, fenofibrate, gabapentin, lisinopril, oxycodone-acetaminophen, rosuvastatin, and prednisolone acetate. Her primarily concern today is the Knee Pain (bilateral, left is worse)  Initial Vital Signs:  Pulse/HCG Rate: 93  Temp: 98.7 F (37.1 C) Resp: 16 BP: 122/85 SpO2: 100 %  BMI: Estimated body mass index is 37.31 kg/m  as calculated from the following:   Height as of this encounter: 5\' 10"  (1.778 m).   Weight as of this encounter: 260 lb (117.9 kg).  Risk Assessment: Allergies: Reviewed. She is allergic to morphine and related; oxycontin [oxycodone hcl]; tramadol; and hydrocodone.  Allergy Precautions: None required Coagulopathies: Reviewed. None identified.  Blood-thinner therapy: None at this time Active Infection(s): Reviewed. None identified. Terri Jennings is afebrile  Site Confirmation: Terri Jennings was asked to confirm the procedure and laterality before marking the site Procedure checklist: Completed Consent: Before the procedure and under the influence of no sedative(s), amnesic(s), or anxiolytics, the patient was informed of the treatment options, risks and possible complications. To fulfill our ethical and legal obligations, as recommended by the American Medical Association's Code of Ethics, I have informed the patient of my clinical impression; the nature and purpose of the treatment or procedure; the risks, benefits, and possible complications of the intervention; the alternatives, including doing nothing; the risk(s) and benefit(s) of the alternative treatment(s) or procedure(s); and the risk(s) and benefit(s) of doing nothing. The patient was provided information about the general risks and possible complications associated with the procedure. These may include, but are not limited to: failure to achieve desired goals, infection, bleeding, organ or nerve damage, allergic reactions, paralysis, and death. In addition, the patient was informed of those risks and complications associated to the procedure, such as failure to decrease pain; infection; bleeding; organ or nerve damage with subsequent damage to sensory, motor, and/or autonomic systems, resulting in permanent pain, numbness, and/or weakness of one or several areas of the body; allergic reactions; (i.e.: anaphylactic reaction); and/or  death. Furthermore, the patient was informed of those risks and complications associated with the medications. These include, but  are not limited to: allergic reactions (i.e.: anaphylactic or anaphylactoid reaction(s)); adrenal axis suppression; blood sugar elevation that in diabetics may result in ketoacidosis or comma; water retention that in patients with history of congestive heart failure may result in shortness of breath, pulmonary edema, and decompensation with resultant heart failure; weight gain; swelling or edema; medication-induced neural toxicity; particulate matter embolism and blood vessel occlusion with resultant organ, and/or nervous system infarction; and/or aseptic necrosis of one or more joints. Finally, the patient was informed that Medicine is not an exact science; therefore, there is also the possibility of unforeseen or unpredictable risks and/or possible complications that may result in a catastrophic outcome. The patient indicated having understood very clearly. We have given the patient no guarantees and we have made no promises. Enough time was given to the patient to ask questions, all of which were answered to the patient's satisfaction. Terri Jennings has indicated that she wanted to continue with the procedure. Attestation: I, the ordering provider, attest that I have discussed with the patient the benefits, risks, side-effects, alternatives, likelihood of achieving goals, and potential problems during recovery for the procedure that I have provided informed consent. Date  Time: 01/09/2019  8:28 AM  Pre-Procedure Preparation:  Monitoring: As per clinic protocol. Respiration, ETCO2, SpO2, BP, heart rate and rhythm monitor placed and checked for adequate function Safety Precautions: Patient was assessed for positional comfort and pressure points before starting the procedure. Time-out: I initiated and conducted the "Time-out" before starting the procedure, as per protocol. The  patient was asked to participate by confirming the accuracy of the "Time Out" information. Verification of the correct person, site, and procedure were performed and confirmed by me, the nursing staff, and the patient. "Time-out" conducted as per Joint Commission's Universal Protocol (UP.01.01.01). Time: 0852  Description of Procedure:          Target Area: Knee Joint Approach: Just above the Medial tibial plateau, lateral to the infrapatellar tendon. Area Prepped: Entire knee area, from the mid-thigh to the mid-shin. Prepping solution: ChloraPrep (2% chlorhexidine gluconate and 70% isopropyl alcohol) Safety Precautions: Aspiration looking for blood return was conducted prior to all injections. At no point did we inject any substances, as a needle was being advanced. No attempts were made at seeking any paresthesias. Safe injection practices and needle disposal techniques used. Medications properly checked for expiration dates. SDV (single dose vial) medications used. Description of the Procedure: Protocol guidelines were followed. The patient was placed in position over the fluoroscopy table. The target area was identified and the area prepped in the usual manner. Skin & deeper tissues infiltrated with local anesthetic. Appropriate amount of time allowed to pass for local anesthetics to take effect. The procedure needles were then advanced to the target area. Proper needle placement secured. Negative aspiration confirmed. Solution injected in intermittent fashion, asking for systemic symptoms every 0.5cc of injectate. The needles were then removed and the area cleansed, making sure to leave some of the prepping solution back to take advantage of its long term bactericidal properties. Vitals:   01/09/19 0836 01/09/19 0838  BP: 122/85 130/72  Pulse: 93 88  Resp: 16 16  Temp: 98.7 F (37.1 C) 98.5 F (36.9 C)  TempSrc: Oral Oral  SpO2: 100% 100%  Weight: 260 lb (117.9 kg)   Height: 5\' 10"  (1.778  m)     Start Time: 0852 hrs. End Time: 0856 hrs. Materials:  Needle(s) Type: Regular needle Gauge: 25G Length: 1.5-in Medication(s): Please see orders  for medications and dosing details.  Imaging Guidance:          Type of Imaging Technique: None used Indication(s): N/A Exposure Time: No patient exposure Contrast: None used. Fluoroscopic Guidance: N/A Ultrasound Guidance: N/A Interpretation: N/A  Antibiotic Prophylaxis:   Anti-infectives (From admission, onward)   None     Indication(s): None identified  Post-operative Assessment:  Post-procedure Vital Signs:  Pulse/HCG Rate: 88  Temp: 98.5 F (36.9 C) Resp: 16 BP: 130/72 SpO2: 100 %  EBL: None  Complications: No immediate post-treatment complications observed by team, or reported by patient.  Note: The patient tolerated the entire procedure well. A repeat set of vitals were taken after the procedure and the patient was kept under observation following institutional policy, for this type of procedure. Post-procedural neurological assessment was performed, showing return to baseline, prior to discharge. The patient was provided with post-procedure discharge instructions, including a section on how to identify potential problems. Should any problems arise concerning this procedure, the patient was given instructions to immediately contact us, at any time, without hesitation. In any case, we plan to contact the patient by telephone for a follow-up status report regarding this interventional procedure.  Comments:  No additional relevant information.  Plan of Care   Imaging Orders  No imaging studies ordered today  Return for Hyalgan knee injection #3 in 4 weeks  Procedure Orders     KNEE INJECTION  Medications ordered for procedure: Meds ordered this encounter  Medications  . lidocaine (XYLOCAINE) 2 % (with pres) injection 400 mg  . Sodium Hyaluronate SOSY 2 mL   Medications administered: We administered  lidocaine and Sodium Hyaluronate.  See the medical record for exact dosing, route, and time of administration.  Disposition: Discharge home  Discharge Date & Time: 01/09/2019; 0900 hrs.   Physician-requested Follow-up: Return in about 4 weeks (around 02/06/2019) for Procedure.  Future Appointments  Date Time Provider Department Center  01/30/2019  9:00 AM Barbette MerinoKing, Crystal M, NP [facility] None  02/08/2019  8:45 AM Edward JollyLateef, Ab[facility]ostrand[facility]   Primary Care Physician: [facility] Note by: Edward JollyBilal Shirely Toren, MD Date: 01/09/2019; Time: 9:24 AM  Disclaimer:  Medicine is not an exact science. The only guarantee in medicine is that nothing is guaranteed. It is important to note that the decision to proceed with this intervention was based on the information collected from the patient. The Data and conclusions were drawn from the patient's questionnaire, the interview, and the physical examination. Because the information was provided in large part by the patient, it cannot be guaranteed that it has not been purposely or unconsciously manipulated. Every effort has been made to obtain as much relevant data as possible for this evaluation. It is important to note that the conclusions that lead to this procedure are derived in large part from the available data. Always take into account that the treatment will also be dependent on availability of resources and existing treatment guidelines, considered by other Pain Management Practitioners as being common knowledge and practice, at the time of the intervention. For Medico-Legal purposes, it is also important to point out that variation in procedural techniques and pharmacological choices are the acceptable norm. The indications, contraindications, technique, and results of the above procedure should only be interpreted and judged by a Board-Certified Interventional Pain Specialist with extensive  familiarity and expertise in the same exact procedure and technique.

## 2019-01-10 ENCOUNTER — Telehealth: Payer: Self-pay | Admitting: *Deleted

## 2019-01-10 NOTE — Telephone Encounter (Signed)
Denies complications post procedure. 

## 2019-01-30 ENCOUNTER — Other Ambulatory Visit: Payer: Self-pay

## 2019-01-30 ENCOUNTER — Encounter: Payer: Self-pay | Admitting: Nurse Practitioner

## 2019-01-30 ENCOUNTER — Ambulatory Visit: Payer: Medicaid Other | Attending: Nurse Practitioner | Admitting: Nurse Practitioner

## 2019-01-30 VITALS — BP 144/70 | HR 87 | Temp 98.3°F | Resp 16 | Ht 70.0 in | Wt 260.0 lb

## 2019-01-30 DIAGNOSIS — M25552 Pain in left hip: Secondary | ICD-10-CM

## 2019-01-30 DIAGNOSIS — M25562 Pain in left knee: Secondary | ICD-10-CM | POA: Diagnosis present

## 2019-01-30 DIAGNOSIS — G894 Chronic pain syndrome: Secondary | ICD-10-CM | POA: Diagnosis present

## 2019-01-30 DIAGNOSIS — Z79891 Long term (current) use of opiate analgesic: Secondary | ICD-10-CM | POA: Insufficient documentation

## 2019-01-30 DIAGNOSIS — M25561 Pain in right knee: Secondary | ICD-10-CM | POA: Diagnosis present

## 2019-01-30 DIAGNOSIS — G8929 Other chronic pain: Secondary | ICD-10-CM | POA: Insufficient documentation

## 2019-01-30 DIAGNOSIS — M5416 Radiculopathy, lumbar region: Secondary | ICD-10-CM | POA: Diagnosis present

## 2019-01-30 MED ORDER — OXYCODONE-ACETAMINOPHEN 10-325 MG PO TABS: 1.0000 | ORAL_TABLET | ORAL | 0 refills | Status: AC | PRN

## 2019-01-30 MED ORDER — DICLOFENAC SODIUM 75 MG PO TBEC: 75.0000 mg | DELAYED_RELEASE_TABLET | Freq: Two times a day (BID) | ORAL | 0 refills | Status: AC

## 2019-01-30 MED ORDER — GABAPENTIN 300 MG PO CAPS: 300.0000 mg | ORAL_CAPSULE | Freq: Three times a day (TID) | ORAL | 0 refills | Status: DC

## 2019-01-30 MED ORDER — OXYCODONE-ACETAMINOPHEN 10-325 MG PO TABS: 1.0000 | ORAL_TABLET | Freq: Three times a day (TID) | ORAL | 0 refills | Status: DC | PRN

## 2019-01-30 NOTE — Progress Notes (Signed)
Patient's Name: Terri Jennings  MRN: [id number]  Referring Provider: [facility] [place]  DOB: 19-Jan-1956  PCP: [facility] [place]: 01/30/2019  Note by: Dionisio David, NP  Service setting: Ambulatory outpatient  Specialty: Interventional Pain Management  Location: [facility] (AMB) Pain Management Facility    Patient type: Established   HPI  Reason for Visit: Ms. Terri Jennings is a 63 y.o. year old, female patient, who comes today with a chief complaint of Knee Pain (bilateral ) and Hip Pain (left) Last Appointment: Her last appointment at our practice was on 01/10/2019. I last saw her on 12/06/2018.  Pain Assessment: Today, Terri Jennings describes the severity of the Chronic pain as a 3 /10. She indicates the location/referral of the pain to be (hip) (left)/knee pain goes down to the foot., . Onset was: More than a month ago. The quality of pain is described as Discomfort, Tingling, Numbness, Sore. Temporal description, or timing of pain is: Constant. Possible modifying factors: medications, procedures . Terri Jennings  height is 5' 10"  (1.778 m) and weight is 260 lb (117.9 kg). Her oral temperature is 98.3 F (36.8 C). Her blood pressure is 144/70 (abnormal) and her pulse is 87. Her respiration is 16 and oxygen saturation is 98%.  She is emotional today. She admits that she is not feeling well. She is going to see her Cardiologist in [place] in 3 weeks; [facility], doctor name has slipped patients mind right now. She feels like the Hyalgan injections may be effective.  She does feel like she needs some attention also to the right knee.  She admits that she is walking more.  Controlled Substance Pharmacotherapy Assessment REMS (Risk Evaluation and Mitigation Strategy)  Analgesic:Oxycodone 5 mg 3 times daily MME/day:22.59m/day. PJanett Billow RN  01/30/2019  8:44 AM  Sign when Signing Visit Nursing Pain Medication Assessment:  Safety  precautions to be maintained throughout the outpatient stay will include: orient to surroundings, keep bed in low position, maintain call bell within reach at all times, provide assistance with transfer out of bed and ambulation.  Medication Inspection Compliance: Pill count conducted under aseptic conditions, in front of the patient. Neither the pills nor the bottle was removed from the patient's sight at any time. Once count was completed pills were immediately returned to the patient in their original bottle.  Medication: Oxycodone/APAP Pill/Patch Count: 6 of 90 pills remain Pill/Patch Appearance: Markings consistent with prescribed medication Bottle Appearance: Standard pharmacy container. Clearly labeled. Filled Date: 01 / 07 / 2020 Last Medication intake:  Yesterday   Pharmacokinetics: Liberation and absorption (onset of action): WNL Distribution (time to peak effect): WNL Metabolism and excretion (duration of action): WNL         Pharmacodynamics: Desired effects: Analgesia: Ms. CDraughonreports >50% benefit. Functional ability: Patient reports that medication allows her to accomplish basic ADLs Clinically meaningful improvement in function (CMIF): Sustained CMIF goals met Perceived effectiveness: Described as relatively effective, allowing for increase in activities of daily living (ADL) Undesirable effects: Side-effects or Adverse reactions: None reported Monitoring: [place] PMP: Online review of the past 160-montheriod conducted. Compliant with practice rules and regulations Last UDS on record: Summary  Date Value Ref Range Status  10/24/2018 FINAL  Final    Comment:    ==================================================================== TOXASSURE SELECT 13 (MW) ==================================================================== Test  Result       Flag       Units Drug Present and Declared for Prescription Verification   Oxycodone                       1084         EXPECTED   ng/mg creat   Oxymorphone                    444          EXPECTED   ng/mg creat   Noroxycodone                   879          EXPECTED   ng/mg creat   Noroxymorphone                 60           EXPECTED   ng/mg creat    Sources of oxycodone are scheduled prescription medications.    Oxymorphone, noroxycodone, and noroxymorphone are expected    metabolites of oxycodone. Oxymorphone is also available as a    scheduled prescription medication. ==================================================================== Test                      Result    Flag   Units      Ref Range   Creatinine              148              mg/dL      >=20 ==================================================================== Declared Medications:  The flagging and interpretation on this report are based on the  following declared medications.  Unexpected results may arise from  inaccuracies in the declared medications.  **Note: The testing scope of this panel includes these medications:  Oxycodone  **Note: The testing scope of this panel does not include following  reported medications:  Amlodipine Besylate  Atropine  Diclofenac  Fenofibrate  Gabapentin  Lisinopril  Prednisolone  Rosuvastatin ==================================================================== For clinical consultation, please call [phone]. ====================================================================    UDS interpretation: Compliant          Medication Assessment Form: Reviewed. Patient indicates being compliant with therapy Treatment compliance: Compliant Risk Assessment Profile: Aberrant behavior: See initial evaluations. None observed or detected today Comorbid factors increasing risk of overdose: See initial evaluation. No additional risks detected today Opioid risk tool (ORT):  Opioid Risk  01/09/2019  Alcohol 0  Illegal Drugs 0  Rx Drugs 0  Alcohol 0  Illegal Drugs 0  Rx Drugs 0  Age  between 16-45 years  0  History of Preadolescent Sexual Abuse 0  Psychological Disease 0  ADD Positive  OCD Negative  Bipolar Negative  Depression 0  Opioid Risk Tool Scoring 0  Opioid Risk Interpretation Low Risk    ORT Scoring interpretation table:  Score <3 = Low Risk for SUD  Score between 4-7 = Moderate Risk for SUD  Score >8 = High Risk for Opioid Abuse   Risk of substance use disorder (SUD): Low  Risk Mitigation Strategies:  Patient Counseling: Covered Patient-Prescriber Agreement (PPA): Present and active  Notification to other healthcare providers: Done  Pharmacologic Plan: No change in therapy, at this time.            Evaluation of last interventional procedure  01/09/2019 Procedure: Left knee Hyalgan injection Pre-procedure pain score:  5/10 Post-procedure pain score: 5/10  Influential Factors: Intra-procedural challenges: None observed.         Reported side-effects: None.        Post-procedural adverse reactions or complications: None reported         Sedation: Please see nurses note for DOS. When no sedatives are used, the analgesic levels obtained are directly associated to the effectiveness of the local anesthetics. However, when sedation is provided, the level of analgesia obtained during the initial 1 hour following the intervention, is believed to be the result of a combination of factors. These factors may include, but are not limited to: 1. The effectiveness of the local anesthetics used. 2. The effects of the analgesic(s) and/or anxiolytic(s) used. 3. The degree of discomfort experienced by the patient at the time of the procedure. 4. The patients ability and reliability in recalling and recording the events. 5. The presence and influence of possible secondary gains and/or psychosocial factors. Reported result: Relief experienced during the 1st hour after the procedure: 100 % (Ultra-Short Term Relief)            Interpretative annotation:  Clinically appropriate result. Analgesia during this period is likely to be Local Anesthetic and/or IV Sedative (Analgesic/Anxiolytic) related.          Effects of local anesthetic: The analgesic effects attained during this period are directly associated to the localized infiltration of local anesthetics and therefore cary significant diagnostic value as to the etiological location, or anatomical origin, of the pain. Expected duration of relief is directly dependent on the pharmacodynamics of the local anesthetic used. Long-acting (4-6 hours) anesthetics used.  Reported result: Relief during the next 4 to 6 hour after the procedure: 100 % (Short-Term Relief)            Interpretative annotation: Clinically appropriate result. Analgesia during this period is likely to be Local Anesthetic-related.          Long-term benefit: Defined as the period of time past the expected duration of local anesthetics (1 hour for short-acting and 4-6 hours for long-acting). With the possible exception of prolonged sympathetic blockade from the local anesthetics, benefits during this period are typically attributed to, or associated with, other factors such as analgesic sensory neuropraxia, antiinflammatory effects, or beneficial biochemical changes provided by agents other than the local anesthetics.  Reported result: Extended relief following procedure: 100 %(patient states that she is having relief currently but with the first exam the pain came back at around  1 month ) (Long-Term Relief)            Interpretative annotation: Clinically appropriate result. Reports improvement in function               ROS  Constitutional: Denies any fever or chills Gastrointestinal: No reported hemesis, hematochezia, vomiting, or acute GI distress Musculoskeletal: Denies any acute onset joint swelling, redness, loss of ROM, or weakness Neurological: No reported episodes of acute onset apraxia, aphasia, dysarthria, agnosia, amnesia,  paralysis, loss of coordination, or loss of consciousness  Medication Review  amLODIPine Besylate, atropine, diclofenac, fenofibrate, gabapentin, lisinopril, oxyCODONE-acetaminophen, prednisoLONE acetate, and rosuvastatin  History Review  Allergy: Terri Jennings is allergic to morphine and related; oxycontin [oxycodone hcl]; tramadol; and hydrocodone. Drug: Terri Jennings  reports no history of drug use. Alcohol:  reports no history of alcohol use. Tobacco:  reports that she has never smoked. She has never used smokeless tobacco. Social: Terri Jennings  reports that she has never smoked. She has never used smokeless tobacco. She reports that  she does not drink alcohol or use drugs. Medical:  has a past medical history of Arthritis, Dysrhythmia, Enlarged heart, and Hypertension. Surgical: Terri Jennings  has a past surgical history that includes Knee surgery (Right, 2009) and Cataract extraction w/PHACO (Right, 03/18/2017). Family: family history includes Cancer in her maternal grandmother; Stroke in her father. Problem List: Terri Jennings has Chronic pain of both knees (L>R) and Chronic hip pain, left on their pertinent problem list.  Lab Review  Kidney Function No results found for: BUN, CREATININE, BCR, GFRAA, GFRNONAALiver Function No results found for: AST, ALT, ALBUMINNote: Above Lab results reviewed.  Imaging Review  MR KNEE LEFT WO CONTRAST CLINICAL DATA:  Left knee pain since May 2019.  EXAM: MRI OF THE LEFT KNEE WITHOUT CONTRAST  TECHNIQUE: Multiplanar, multisequence MR imaging of the knee was performed. No intravenous contrast was administered.  COMPARISON:  None.  FINDINGS: MENISCI  Medial meniscus: Radial tear of the posterior horn of the medial meniscus with peripheral meniscal extrusion. Small undersurface tear of the body of the medial meniscus.  Lateral meniscus:  Intact.  LIGAMENTS  Cruciates:  Intact ACL and PCL.  Collaterals: Medial collateral ligament is intact.  Lateral collateral ligament complex is intact.  CARTILAGE  Patellofemoral: Partial-thickness cartilage loss of the patellar apex with minimal subchondral reactive marrow changes. High-grade partial-thickness cartilage loss with areas of full-thickness cartilage loss of the trochlear groove with subchondral reactive marrow changes.  Medial: High-grade partial-thickness cartilage loss with areas of full-thickness cartilage loss of the medial femoral condyle and medial tibial plateau. Osteochondral lesion involving the weight-bearing surface of the medial femoral condyle measuring 10 x 23 mm with cystic changes and surrounding marrow edema at the OCL-bone interface.  Lateral:  No chondral defect.  Joint: Small joint effusion. Normal Hoffa's fat. No plical thickening.  Popliteal Fossa:  Tiny Baker's cyst.  Intact popliteus tendon.  Extensor Mechanism: Intact quadriceps tendon. Intact patellar tendon. Intact medial patellar retinaculum. Intact lateral patellar retinaculum. Intact MPFL.  Bones:  No acute osseous abnormality.  No aggressive osseous lesion.  Other: No fluid collection or hematoma.  IMPRESSION: 1. Radial tear of the posterior horn of the medial meniscus with peripheral meniscal extrusion. Small undersurface tear of the body of the medial meniscus. 2. Partial-thickness cartilage loss of the patellar apex with minimal subchondral reactive marrow changes. High-grade partial-thickness cartilage loss with areas of full-thickness cartilage loss of the trochlear groove with subchondral reactive marrow changes. 3. High-grade partial-thickness cartilage loss with areas of full-thickness cartilage loss of the medial femoral condyle and medial tibial plateau. Osteochondral lesion involving the weight-bearing surface of the medial femoral condyle measuring 10 x 23 mm with cystic changes and surrounding marrow edema at the OCL-bone interface.  Electronically Signed   By:  Kathreen Devoid   On: 08/09/2018 11:58 Note: Reviewed        Physical Exam  General appearance: alert and morbidly obese Mental status: Alert, oriented x 3 (person, place, & time)       Respiratory: No evidence of acute respiratory distress Eyes: PERLA Vitals: BP (!) 144/70 (BP Location: Right Arm, Patient Position: Sitting, Cuff Size: Large)   Pulse 87   Temp 98.3 F (36.8 C) (Oral)   Resp 16   Ht 5' 10"  (1.778 m)   Wt 260 lb (117.9 kg)   SpO2 98%   BMI 37.31 kg/m  BMI: Estimated body mass index is 37.31 kg/m as calculated from the following:   Height as of this encounter: 5' 10"  (  1.778 m).   Weight as of this encounter: 260 lb (117.9 kg). Ideal: Ideal body weight: 68.5 kg (151 lb 0.2 oz) Adjusted ideal body weight: 88.3 kg (194 lb 9.7 oz) Gait & Posture Assessment  Ambulation: Unassisted Gait: Relatively normal for age and body habitus Posture: WNL  Lower Extremity Exam    Side: Right lower extremity  Side: Left lower extremity  Stability: No instability observed          Stability: No instability observed          Skin & Extremity Inspection: Skin color, temperature, and hair growth are WNL. No peripheral edema or cyanosis. No masses, redness, swelling, asymmetry, or associated skin lesions. No contractures.  Skin & Extremity Inspection: Skin color, temperature, and hair growth are WNL. No peripheral edema or cyanosis. No masses, redness, swelling, asymmetry, or associated skin lesions. No contractures.  Functional ROM: Adequate ROM                  Functional ROM: Adequate ROM                  Muscle Tone/Strength: Guarding  Muscle Tone/Strength: Guarding of hip and knee  Sensory (Neurological): Movement-associated discomfort        Sensory (Neurological): Movement-associated discomfort            Palpation: No palpable anomalies  Palpation: No palpable anomalies   Assessment   Status Diagnosis  Persistent Worsening Controlled 1. Chronic pain of both knees (L>R)   2.  Chronic hip pain, left   3. Chronic radicular low back pain   4. Chronic pain syndrome   5. Long term current use of opiate analgesic      Updated Problems: Problem  Chronic Hip Pain, Left    Plan of Care  Pharmacotherapy (Medications Ordered): Meds ordered this encounter  Medications  . gabapentin (NEURONTIN) 300 MG capsule    Sig: Take 1 capsule (300 mg total) by mouth 3 (three) times daily. 300 mg qday, 600 mg qhs    Dispense:  270 capsule    Refill:  0    Order Specific Question:   Supervising Provider    Answer:   Gillis Santa [[id number]]  . diclofenac (VOLTAREN) 75 MG EC tablet    Sig: Take 1 tablet (75 mg total) by mouth 2 (two) times daily.    Dispense:  180 tablet    Refill:  0    Order Specific Question:   Supervising Provider    Answer:   Gillis Santa [id number]  . oxyCODONE-acetaminophen (PERCOCET) 10-325 MG tablet    Sig: Take 1 tablet by mouth every 8 (eight) hours as needed for up to 30 days for pain.    Dispense:  135 tablet    Refill:  0    Do not place this medication, or any other prescription from our practice, on "Automatic Refill". Patient may have prescription filled one day early if pharmacy is closed on scheduled refill date.    Order Specific Question:   Supervising Provider    Answer:  Pt use 0.5 of 10 mg tab Gillis Santa [[id number]]   Administered today: Genella Rife had no medications administered during this visit.  Orders:  No orders of the defined types were placed in this encounter.  Interventional options: Planned follow-up:   Continue Left Hyalagen Series as scheduled Plan: Return in about 3 months (around 05/02/2019) for Appointment As Scheduled, and, MedMgmt.   Note by: Dionisio David, NP Date:  01/30/2019; Time: 9:36 AM

## 2019-01-30 NOTE — Patient Instructions (Signed)
____________________________________________________________________________________________  Medication Rules  Purpose: To inform patients, and their family members, of our rules and regulations.  Applies to: All patients receiving prescriptions (written or electronic).  Pharmacy of record: Pharmacy where electronic prescriptions will be sent. If written prescriptions are taken to a different pharmacy, please inform the nursing staff. The pharmacy listed in the electronic medical record should be the one where you would like electronic prescriptions to be sent.  Electronic prescriptions: In compliance with the [place] Strengthen Opioid Misuse Prevention (STOP) Act of 2017 (Session Law 2017-74/H243), effective November 30, 2018, all controlled substances must be electronically prescribed. Calling prescriptions to the pharmacy will cease to exist.  Prescription refills: Only during scheduled appointments. Applies to all prescriptions.  NOTE: The following applies primarily to controlled substances (Opioid* Pain Medications).   Patient's responsibilities: 1. Pain Pills: Bring all pain pills to every appointment (except for procedure appointments). 2. Pill Bottles: Bring pills in original pharmacy bottle. Always bring the newest bottle. Bring bottle, even if empty. 3. Medication refills: You are responsible for knowing and keeping track of what medications you take and those you need refilled. The day before your appointment: write a list of all prescriptions that need to be refilled. The day of the appointment: give the list to the admitting nurse. Prescriptions will be written only during appointments. No prescriptions will be written on procedure days. If you forget a medication: it will not be "Called in", "Faxed", or "electronically sent". You will need to get another appointment to get these prescribed. No early refills. Do not call asking to have your prescription filled  early. 4. Prescription Accuracy: You are responsible for carefully inspecting your prescriptions before leaving our office. Have the discharge nurse carefully go over each prescription with you, before taking them home. Make sure that your name is accurately spelled, that your address is correct. Check the name and dose of your medication to make sure it is accurate. Check the number of pills, and the written instructions to make sure they are clear and accurate. Make sure that you are given enough medication to last until your next medication refill appointment. 5. Taking Medication: Take medication as prescribed. When it comes to controlled substances, taking less pills or less frequently than prescribed is permitted and encouraged. Never take more pills than instructed. Never take medication more frequently than prescribed.  6. Inform other Doctors: Always inform, all of your healthcare providers, of all the medications you take. 7. Pain Medication from other Providers: You are not allowed to accept any additional pain medication from any other Doctor or Healthcare provider. There are two exceptions to this rule. (see below) In the event that you require additional pain medication, you are responsible for notifying us, as stated below. 8. Medication Agreement: You are responsible for carefully reading and following our Medication Agreement. This must be signed before receiving any prescriptions from our practice. Safely store a copy of your signed Agreement. Violations to the Agreement will result in no further prescriptions. (Additional copies of our Medication Agreement are available upon request.) 9. Laws, Rules, & Regulations: All patients are expected to follow all Federal and State Laws, Statutes, Rules, & Regulations. Ignorance of the Laws does not constitute a valid excuse. The use of any illegal substances is prohibited. 10. Adopted CDC guidelines & recommendations: Target dosing levels will be  at or below 60 MME/day. Use of benzodiazepines** is not recommended.  Exceptions: There are only two exceptions to the rule of not   receiving pain medications from other Healthcare Providers. 1. Exception #1 (Emergencies): In the event of an emergency (i.e.: accident requiring emergency care), you are allowed to receive additional pain medication. However, you are responsible for: As soon as you are able, call our office [phone], at any time of the day or night, and leave a message stating your name, the date and nature of the emergency, and the name and dose of the medication prescribed. In the event that your call is answered by a member of our staff, make sure to document and save the date, time, and the name of the person that took your information.  2. Exception #2 (Planned Surgery): In the event that you are scheduled by another doctor or dentist to have any type of surgery or procedure, you are allowed (for a period no longer than 30 days), to receive additional pain medication, for the acute post-op pain. However, in this case, you are responsible for picking up a copy of our "Post-op Pain Management for Surgeons" handout, and giving it to your surgeon or dentist. This document is available at our office, and does not require an appointment to obtain it. Simply go to our office during business hours (Monday-Thursday from 8:00 AM to 4:00 PM) (Friday 8:00 AM to 12:00 Noon) or if you have a scheduled appointment with us, prior to your surgery, and ask for it by name. In addition, you will need to provide us with your name, name of your surgeon, type of surgery, and date of procedure or surgery.  *Opioid medications include: morphine, codeine, oxycodone, oxymorphone, hydrocodone, hydromorphone, meperidine, tramadol, tapentadol, buprenorphine, fentanyl, methadone. **Benzodiazepine medications include: diazepam (Valium), alprazolam (Xanax), clonazepam (Klonopine), lorazepam (Ativan), clorazepate  (Tranxene), chlordiazepoxide (Librium), estazolam (Prosom), oxazepam (Serax), temazepam (Restoril), triazolam (Halcion) (Last updated: 01/27/2018) ____________________________________________________________________________________________    

## 2019-01-30 NOTE — Progress Notes (Signed)
Nursing Pain Medication Assessment:  Safety precautions to be maintained throughout the outpatient stay will include: orient to surroundings, keep bed in low position, maintain call bell within reach at all times, provide assistance with transfer out of bed and ambulation.  Medication Inspection Compliance: Pill count conducted under aseptic conditions, in front of the patient. Neither the pills nor the bottle was removed from the patient's sight at any time. Once count was completed pills were immediately returned to the patient in their original bottle.  Medication: Oxycodone/APAP Pill/Patch Count: 6 of 90 pills remain Pill/Patch Appearance: Markings consistent with prescribed medication Bottle Appearance: Standard pharmacy container. Clearly labeled. Filled Date: 01 / 07 / 2020 Last Medication intake:  Yesterday

## 2019-02-08 ENCOUNTER — Ambulatory Visit
Payer: Medicaid Other | Attending: Student in an Organized Health Care Education/Training Program | Admitting: Student in an Organized Health Care Education/Training Program

## 2019-02-08 ENCOUNTER — Other Ambulatory Visit: Payer: Self-pay

## 2019-02-08 ENCOUNTER — Encounter: Payer: Self-pay | Admitting: Student in an Organized Health Care Education/Training Program

## 2019-02-08 VITALS — BP 157/97 | HR 85 | Temp 99.3°F | Resp 16 | Ht 70.0 in | Wt 260.0 lb

## 2019-02-08 DIAGNOSIS — M25562 Pain in left knee: Secondary | ICD-10-CM | POA: Diagnosis present

## 2019-02-08 DIAGNOSIS — G8929 Other chronic pain: Secondary | ICD-10-CM

## 2019-02-08 MED ORDER — LIDOCAINE HCL (PF) 1 % IJ SOLN
5.0000 mL | Freq: Once | INTRAMUSCULAR | Status: AC
  Administered 2019-02-08: 5 mL

## 2019-02-08 MED ORDER — SODIUM HYALURONATE (VISCOSUP) 20 MG/2ML IX SOSY
2.0000 mL | PREFILLED_SYRINGE | Freq: Once | INTRA_ARTICULAR | Status: AC
  Administered 2019-02-08: 2 mL via INTRA_ARTICULAR

## 2019-02-08 NOTE — Patient Instructions (Signed)

## 2019-02-08 NOTE — Progress Notes (Signed)
Safety precautions to be maintained throughout the outpatient stay will include: orient to surroundings, keep bed in low position, maintain call bell within reach at all times, provide assistance with transfer out of bed and ambulation.  

## 2019-02-08 NOTE — Progress Notes (Signed)
Patient's Name: Terri Jennings  MRN: [id number]  Referring Provider: [facility]  DOB: Aug 12, 1956  PCP: [facility]  DOS: 02/08/2019  Note by: Edward Jolly, MD  Service setting: Ambulatory outpatient  Specialty: Interventional Pain Management  Patient type: Established  Location: [facility] (AMB) Pain Management Facility  Visit type: Interventional Procedure   Primary Reason for Visit: Interventional Pain Management Treatment. CC: Knee Pain (left)  Procedure:          Anesthesia, Analgesia, Anxiolysis:  Type: Therapeutic Intra-Articular Hyalgan Knee Injection #3  Region: Medial infrapatellar Knee Region Level: Knee Joint Laterality: Left knee  Type: Local Anesthesia Indication(s): Analgesia         Local Anesthetic: Lidocaine 1-2% Route: Infiltration ([place]/IM) IV Access: Declined Sedation: Declined   Position: Sitting   Indications: 1. Chronic pain of left knee    Pain Score: Pre-procedure: 3 /10 Post-procedure: 3 /10  Patient endorses benefit after injection #2.  Endorses reduction in pain and also improvement walking.  States that she has return of pain approximately 10 days ago.  Will repeat intra-articular left Hyalgan injection #3 today.  Pre-op Assessment:  Terri Jennings is a 63 y.o. (year old), female patient, seen today for interventional treatment. She  has a past surgical history that includes Knee surgery (Right, 2009) and Cataract extraction w/PHACO (Right, 03/18/2017). Terri Jennings has a current medication list which includes the following prescription(s): amlodipine besylate, atropine, diclofenac, fenofibrate, gabapentin, lisinopril, oxycodone-acetaminophen, prednisolone acetate, and rosuvastatin. Her primarily concern today is the Knee Pain (left)  Initial Vital Signs:  Pulse/HCG Rate: 84  Temp: 99.3 F (37.4 C) Resp: 18 BP: (!) 152/87 SpO2: 98 %  BMI: Estimated body mass index is 37.31 kg/m as calculated from the following:    Height as of this encounter:  (1.778 m).   Weight as of this encounter: 260 lb (117.9 kg).  Risk Assessment: Allergies: Reviewed. She is allergic to morphine and related; oxycontin [oxycodone hcl]; tramadol; and hydrocodone.  Allergy Precautions: None required Coagulopathies: Reviewed. None identified.  Blood-thinner therapy: None at this time Active Infection(s): Reviewed. None identified. Terri Jennings is afebrile  Site Confirmation: Terri Jennings was asked to confirm the procedure and laterality before marking the site Procedure checklist: Completed Consent: Before the procedure and under the influence of no sedative(s), amnesic(s), or anxiolytics, the patient was informed of the treatment options, risks and possible complications. To fulfill our ethical and legal obligations, as recommended by the American Medical Association's Code of Ethics, I have informed the patient of my clinical impression; the nature and purpose of the treatment or procedure; the risks, benefits, and possible complications of the intervention; the alternatives, including doing nothing; the risk(s) and benefit(s) of the alternative treatment(s) or procedure(s); and the risk(s) and benefit(s) of doing nothing. The patient was provided information about the general risks and possible complications associated with the procedure. These may include, but are not limited to: failure to achieve desired goals, infection, bleeding, organ or nerve damage, allergic reactions, paralysis, and death. In addition, the patient was informed of those risks and complications associated to the procedure, such as failure to decrease pain; infection; bleeding; organ or nerve damage with subsequent damage to sensory, motor, and/or autonomic systems, resulting in permanent pain, numbness, and/or weakness of one or several areas of the body; allergic reactions; (i.e.: anaphylactic reaction); and/or death. Furthermore, the patient was informed of  those risks and complications associated with the medications. These include, but are not limited to: allergic  reactions (i.e.: anaphylactic or anaphylactoid reaction(s)); adrenal axis suppression; blood sugar elevation that in diabetics may result in ketoacidosis or comma; water retention that in patients with history of congestive heart failure may result in shortness of breath, pulmonary edema, and decompensation with resultant heart failure; weight gain; swelling or edema; medication-induced neural toxicity; particulate matter embolism and blood vessel occlusion with resultant organ, and/or nervous system infarction; and/or aseptic necrosis of one or more joints. Finally, the patient was informed that Medicine is not an exact science; therefore, there is also the possibility of unforeseen or unpredictable risks and/or possible complications that may result in a catastrophic outcome. The patient indicated having understood very clearly. We have given the patient no guarantees and we have made no promises. Enough time was given to the patient to ask questions, all of which were answered to the patient's satisfaction. Terri Jennings has indicated that she wanted to continue with the procedure. Attestation: I, the ordering provider, attest that I have discussed with the patient the benefits, risks, side-effects, alternatives, likelihood of achieving goals, and potential problems during recovery for the procedure that I have provided informed consent. Date  Time: 02/08/2019  8:00 AM  Pre-Procedure Preparation:  Monitoring: As per clinic protocol. Respiration, ETCO2, SpO2, BP, heart rate and rhythm monitor placed and checked for adequate function Safety Precautions: Patient was assessed for positional comfort and pressure points before starting the procedure. Time-out: I initiated and conducted the "Time-out" before starting the procedure, as per protocol. The patient was asked to participate by confirming the  accuracy of the "Time Out" information. Verification of the correct person, site, and procedure were performed and confirmed by me, the nursing staff, and the patient. "Time-out" conducted as per Joint Commission's Universal Protocol (UP.01.01.01). Time: 0822  Description of Procedure:          Target Area: Knee Joint Approach: Just above the Medial tibial plateau, lateral to the infrapatellar tendon. Area Prepped: Entire knee area, from the mid-thigh to the mid-shin. Prepping solution: ChloraPrep (2% chlorhexidine gluconate and 70% isopropyl alcohol) Safety Precautions: Aspiration looking for blood return was conducted prior to all injections. At no point did we inject any substances, as a needle was being advanced. No attempts were made at seeking any paresthesias. Safe injection practices and needle disposal techniques used. Medications properly checked for expiration dates. SDV (single dose vial) medications used. Description of the Procedure: Protocol guidelines were followed. The patient was placed in position over the fluoroscopy table. The target area was identified and the area prepped in the usual manner. Skin & deeper tissues infiltrated with local anesthetic. Appropriate amount of time allowed to pass for local anesthetics to take effect. The procedure needles were then advanced to the target area. Proper needle placement secured. Negative aspiration confirmed. Solution injected in intermittent fashion, asking for systemic symptoms every 0.5cc of injectate. The needles were then removed and the area cleansed, making sure to leave some of the prepping solution back to take advantage of its long term bactericidal properties. Vitals:   02/08/19 0806 02/08/19 0830  BP: (!) 152/87 (!) 157/97  Pulse: 84 85  Resp: 18 16  Temp: 99.3 F (37.4 C)   TempSrc: Oral   SpO2: 98% 99%  Weight: 260 lb (117.9 kg)   Height: 5\' 10"  (1.778 m)     Start Time: 0822 hrs. End Time: 0830 hrs. Materials:   Needle(s) Type: Regular needle Gauge: 25G Length: 1.5-in Medication(s): Please see orders for medications and dosing details.  Imaging Guidance:          Type of Imaging Technique: None used Indication(s): N/A Exposure Time: No patient exposure Contrast: None used. Fluoroscopic Guidance: N/A Ultrasound Guidance: N/A Interpretation: N/A  Antibiotic Prophylaxis:   Anti-infectives (From admission, onward)   None     Indication(s): None identified  Post-operative Assessment:  Post-procedure Vital Signs:  Pulse/HCG Rate: 85  Temp: 99.3 F (37.4 C) Resp: 16 BP: (!) 157/97 SpO2: 99 %  EBL: None  Complications: No immediate post-treatment complications observed by team, or reported by patient.  Note: The patient tolerated the entire procedure well. A repeat set of vitals were taken after the procedure and the patient was kept under observation following institutional policy, for this type of procedure. Post-procedural neurological assessment was performed, showing return to baseline, prior to discharge. The patient was provided with post-procedure discharge instructions, including a section on how to identify potential problems. Should any problems arise concerning this procedure, the patient was given instructions to immediately contact us, at any time, without hesitation. In any case, we plan to contact the patient by telephone for a follow-up status report regarding this interventional procedure.  Comments:  No additional relevant information.  Plan of Care   Imaging Orders  No imaging studies ordered today  Return for Hyalgan knee injection #4 PRN  Procedure Orders     KNEE INJECTION  Medications ordered for procedure: Meds ordered this encounter  Medications  . lidocaine (PF) (XYLOCAINE) 1 % injection 5 mL  . Sodium Hyaluronate SOSY 2 mL   Medications administered: We administered lidocaine (PF) and Sodium Hyaluronate.  See the medical record for exact dosing,  route, and time of administration.  Disposition: Discharge home  Discharge Date & Time: 02/08/2019; 0835 hrs.   Physician-requested Follow-up: Return if symptoms worsen or fail to improve.  Future Appointments  Date Time Provider Department Center  05/02/2019  8:30 AM Barbette Merino, NP [facility] None   Primary Care Physician: [facility] Location: [facility] Outpatient Pain Management Facility Note by: Edward Jolly, MD Date: 02/08/2019; Time: 11:01 AM  Disclaimer:  Medicine is not an exact science. The only guarantee in medicine is that nothing is guaranteed. It is important to note that the decision to proceed with this intervention was based on the information collected from the patient. The Data and conclusions were drawn from the patient's questionnaire, the interview, and the physical examination. Because the information was provided in large part by the patient, it cannot be guaranteed that it has not been purposely or unconsciously manipulated. Every effort has been made to obtain as much relevant data as possible for this evaluation. It is important to note that the conclusions that lead to this procedure are derived in large part from the available data. Always take into account that the treatment will also be dependent on availability of resources and existing treatment guidelines, considered by other Pain Management Practitioners as being common knowledge and practice, at the time of the intervention. For Medico-Legal purposes, it is also important to point out that variation in procedural techniques and pharmacological choices are the acceptable norm. The indications, contraindications, technique, and results of the above procedure should only be interpreted and judged by a Board-Certified Interventional Pain Specialist with extensive familiarity and expertise in the same exact procedure and technique.

## 2019-02-09 ENCOUNTER — Telehealth: Payer: Self-pay

## 2019-02-09 NOTE — Telephone Encounter (Signed)
Post procedure phone call. Patient states she is doing good.  

## 2019-05-02 ENCOUNTER — Ambulatory Visit: Payer: Medicaid Other | Admitting: Nurse Practitioner

## 2019-05-03 ENCOUNTER — Encounter: Payer: Self-pay | Admitting: Student in an Organized Health Care Education/Training Program

## 2019-05-03 ENCOUNTER — Ambulatory Visit
Payer: Medicaid Other | Attending: Nurse Practitioner | Admitting: Student in an Organized Health Care Education/Training Program

## 2019-05-03 ENCOUNTER — Other Ambulatory Visit: Payer: Self-pay

## 2019-05-03 DIAGNOSIS — M25561 Pain in right knee: Secondary | ICD-10-CM

## 2019-05-03 DIAGNOSIS — M25562 Pain in left knee: Secondary | ICD-10-CM

## 2019-05-03 DIAGNOSIS — G894 Chronic pain syndrome: Secondary | ICD-10-CM

## 2019-05-03 DIAGNOSIS — G8929 Other chronic pain: Secondary | ICD-10-CM

## 2019-05-03 DIAGNOSIS — Z9889 Other specified postprocedural states: Secondary | ICD-10-CM | POA: Diagnosis not present

## 2019-05-03 DIAGNOSIS — M5416 Radiculopathy, lumbar region: Secondary | ICD-10-CM

## 2019-05-03 DIAGNOSIS — M1711 Unilateral primary osteoarthritis, right knee: Secondary | ICD-10-CM

## 2019-05-03 MED ORDER — GABAPENTIN 300 MG PO CAPS: 300.0000 mg | ORAL_CAPSULE | Freq: Three times a day (TID) | ORAL | 0 refills | Status: DC

## 2019-05-03 MED ORDER — OXYCODONE-ACETAMINOPHEN 10-325 MG PO TABS: 1.0000 | ORAL_TABLET | Freq: Three times a day (TID) | ORAL | 0 refills | Status: DC | PRN

## 2019-05-03 NOTE — Progress Notes (Signed)
Pain Management Virtual Encounter Note - Virtual Visit via Video Conference Telehealth (real-time audio visits between healthcare provider and patient).  Patient's Phone No. & Preferred Pharmacy:  [phone] (home); [phone] (mobile); (Preferred) [phone] No e-mail address on record  [id number] [place], [place] - [address] [place] [address] Phone: [phone] Fax: [phone]  [facility] [address] - [place]), [place] - [address] [place] [address] Phone: [phone] Fax: [phone]   Pre-screening note:  Our staff contacted Terri Jennings and offered her an "in person", "face-to-face" appointment versus a telephone encounter. She indicated preferring the telephone encounter, at this time.  Reason for Virtual Visit: COVID-19*  Social distancing based on CDC and AMA recommendations.   I contacted Terri Jennings on 05/03/2019 at 11:09 AM via video conference.      I clearly identified myself as Terri JollyBilal Laquetta Racey, MD. I verified that I was speaking with the correct person using two identifiers (Name and date of birth: 1956-11-07).  Advanced Informed Consent I sought verbal advanced consent from Terri GreenlandElizabeth Jennings Jennings for virtual visit interactions. I informed Terri Jennings of possible security and privacy concerns, risks, and limitations associated with providing "not-in-person" medical evaluation and management services. I also informed Terri Jennings of the availability of "in-person" appointments. Finally, I informed her that there would be a charge for the virtual visit and that she could be  personally, fully or partially, financially responsible for it. Terri Jennings expressed understanding and agreed to proceed.   Historic Elements   [name] is a 63 y.o. year old, female patient evaluated today after her last encounter by our practice on 02/09/2019. Terri Jennings   has a past medical history of Arthritis, Dysrhythmia, Enlarged heart, and Hypertension. She also  has a past surgical history that includes Knee surgery (Right, 2009) and Cataract extraction w/PHACO (Right, 03/18/2017). Terri Jennings has a current medication list which includes the following prescription(s): amlodipine besylate, atropine, fenofibrate, gabapentin, lisinopril, oxycodone-acetaminophen, prednisolone acetate, and rosuvastatin. She  reports that she has never smoked. She has never used smokeless tobacco. She reports that she does not drink alcohol or use drugs. Terri Jennings is allergic to morphine and related; oxycontin [oxycodone hcl]; tramadol; and hydrocodone.   HPI  Today, she is being contacted for medication management.  No change in medical history.  Pain at baseline.  Last prescription for Percocet was 10 mg quantity 135.  Patient takes half a tablet, 5 mg at a time.  This prescription lasted her 3 months.  States that it is easier for her to get a 6834-month prescription from a financial standpoint.  Is also requesting refill of gabapentin.  Would like to finish out her intra-articular Hyalgan left knee injection series.  Need to complete #4 & #5.  Pharmacotherapy Assessment   01/30/2019  1   01/30/2019  Oxycodone-Acetaminophen 10-325  135.00 30 Cr Kin   [id number]   Nor (4575)   0  67.50 MME  Medicaid   [place]     Monitoring: Pharmacotherapy: No side-effects or adverse reactions reported. [place] PMP: PDMP reviewed during this encounter.       Compliance: No problems identified. Effectiveness: Clinically acceptable. Plan: Refer to "POC".  Pertinent Labs  Renal Function No results found for: BUN, CREATININE, BCR, GFRAA, GFRNONAA Hepatic Function No results found for: AST, ALT, ALBUMIN UDS Summary  Date Value Ref Range Status  10/24/2018 FINAL  Final    Comment:    ====================================================================  TOXASSURE SELECT 13  (MW) ==================================================================== Test                             Result       Flag       Units Drug Present and Declared for Prescription Verification   Oxycodone                      1084         EXPECTED   ng/mg creat   Oxymorphone                    444          EXPECTED   ng/mg creat   Noroxycodone                   879          EXPECTED   ng/mg creat   Noroxymorphone                 60           EXPECTED   ng/mg creat    Sources of oxycodone are scheduled prescription medications.    Oxymorphone, noroxycodone, and noroxymorphone are expected    metabolites of oxycodone. Oxymorphone is also available as a    scheduled prescription medication. ==================================================================== Test                      Result    Flag   Units      Ref Range   Creatinine              148              mg/dL      >=16 ==================================================================== Declared Medications:  The flagging and interpretation on this report are based on the  following declared medications.  Unexpected results may arise from  inaccuracies in the declared medications.  **Note: The testing scope of this panel includes these medications:  Oxycodone  **Note: The testing scope of this panel does not include following  reported medications:  Amlodipine Besylate  Atropine  Diclofenac  Fenofibrate  Gabapentin  Lisinopril  Prednisolone  Rosuvastatin ==================================================================== For clinical consultation, please call [phone]. ====================================================================    Note: Above Lab results reviewed.  Recent imaging  MR KNEE LEFT WO CONTRAST CLINICAL DATA:  Left knee pain since May 2019.  EXAM: MRI OF THE LEFT KNEE WITHOUT CONTRAST  TECHNIQUE: Multiplanar, multisequence MR imaging of the knee was performed. No intravenous contrast  was administered.  COMPARISON:  None.  FINDINGS: MENISCI  Medial meniscus: Radial tear of the posterior horn of the medial meniscus with peripheral meniscal extrusion. Small undersurface tear of the body of the medial meniscus.  Lateral meniscus:  Intact.  LIGAMENTS  Cruciates:  Intact ACL and PCL.  Collaterals: Medial collateral ligament is intact. Lateral collateral ligament complex is intact.  CARTILAGE  Patellofemoral: Partial-thickness cartilage loss of the patellar apex with minimal subchondral reactive marrow changes. High-grade partial-thickness cartilage loss with areas of full-thickness cartilage loss of the trochlear groove with subchondral reactive marrow changes.  Medial: High-grade partial-thickness cartilage loss with areas of full-thickness cartilage loss of the medial femoral condyle and medial tibial plateau. Osteochondral lesion involving the weight-bearing surface of the medial femoral condyle measuring 10 x 23 mm with cystic changes and surrounding marrow edema at the OCL-bone interface.  Lateral:  No chondral defect.  Joint: Small joint effusion. Normal Hoffa's fat. No plical thickening.  Popliteal Fossa:  Tiny Baker's cyst.  Intact popliteus tendon.  Extensor Mechanism: Intact quadriceps tendon. Intact patellar tendon. Intact medial patellar retinaculum. Intact lateral patellar retinaculum. Intact MPFL.  Bones:  No acute osseous abnormality.  No aggressive osseous lesion.  Other: No fluid collection or hematoma.  IMPRESSION: 1. Radial tear of the posterior horn of the medial meniscus with peripheral meniscal extrusion. Small undersurface tear of the body of the medial meniscus. 2. Partial-thickness cartilage loss of the patellar apex with minimal subchondral reactive marrow changes. High-grade partial-thickness cartilage loss with areas of full-thickness cartilage loss of the trochlear groove with subchondral reactive marrow  changes. 3. High-grade partial-thickness cartilage loss with areas of full-thickness cartilage loss of the medial femoral condyle and medial tibial plateau. Osteochondral lesion involving the weight-bearing surface of the medial femoral condyle measuring 10 x 23 mm with cystic changes and surrounding marrow edema at the OCL-bone interface.  Electronically Signed   By: Elige Ko   On: 08/09/2018 11:58  Assessment  The primary encounter diagnosis was Chronic pain of both knees (L>R). Diagnoses of Chronic pain of left knee, Chronic radicular low back pain, H/O right knee surgery, Primary osteoarthritis of right knee, Chronic pain of right knee, and Chronic pain syndrome were also pertinent to this visit.  Plan of Care  I have changed Tennis Must. Tabet's gabapentin. I am also having her start on oxyCODONE-acetaminophen. Additionally, I am having her maintain her lisinopril, AMLODIPINE BESYLATE PO, atropine, prednisoLONE acetate, rosuvastatin, and fenofibrate.  Prescription for gabapentin below and oxycodone below, quantity #135 to last for 3 months.  Procedure order for left knee intra-articular Hyalgan No. 4 without sedation.  Perform UDS at next visit.  Pharmacotherapy (Medications Ordered): Meds ordered this encounter  Medications  . gabapentin (NEURONTIN) 300 MG capsule    Sig: Take 1 capsule (300 mg total) by mouth 3 (three) times daily.    Dispense:  270 capsule    Refill:  0  . oxyCODONE-acetaminophen (PERCOCET) 10-325 MG tablet    Sig: Take 1 tablet by mouth every 8 (eight) hours as needed for pain. Must last 90 days.    Dispense:  135 tablet    Refill:  0    Chronic Pain. (STOP Act - Not applicable). Fill one day early if closed on scheduled refill date.   Orders:  Orders Placed This Encounter  Procedures  . KNEE INJECTION    Hyalgan knee injection. Please order Hyalgan.    Standing Status:   Future    Standing Expiration Date:   06/02/2019    Scheduling  Instructions:     Procedure: Intra-articular Hyalgan Knee injection  #4           Side: LEFT     Sedation: None     Timeframe: in two (2) weeks    Order Specific Question:   Where will this procedure be performed?    Answer:   [facility]   Follow-up plan:   Return in about 3 weeks (around 05/24/2019) for Procedure left knee Hyalgan No. 4.    I discussed the assessment and treatment plan with the patient. The patient was provided an opportunity to ask questions and all were answered. The patient agreed with the plan and demonstrated an understanding of the instructions.  Patient advised to call back or seek an in-person evaluation if the symptoms or condition worsens.  Total duration of non-face-to-face  encounter: 25 minutes.  Note by: Terri Jolly, MD Date: 05/03/2019; Time: 11:09 AM  Note: This dictation was prepared with Dragon dictation. Any transcriptional errors that may result from this process are unintentional.  Disclaimer:  * Given the special circumstances of the COVID-19 pandemic, the federal government has announced that the Office for Civil Rights (OCR) will exercise its enforcement discretion and will not impose penalties on physicians using telehealth in the event of noncompliance with regulatory requirements under the [employer] Portability and Accountability Act (HIPAA) in connection with the good faith provision of telehealth during the COVID-19 national public health emergency. (AMA)

## 2019-05-08 ENCOUNTER — Telehealth: Payer: Self-pay | Admitting: Student in an Organized Health Care Education/Training Program

## 2019-05-08 NOTE — Telephone Encounter (Signed)
Done 05/04/19. °

## 2019-05-08 NOTE — Telephone Encounter (Signed)
Pt states that oxy rx needs PA

## 2019-05-11 ENCOUNTER — Telehealth: Payer: Self-pay | Admitting: *Deleted

## 2019-05-11 ENCOUNTER — Telehealth: Payer: Self-pay | Admitting: Student in an Organized Health Care Education/Training Program

## 2019-05-11 MED ORDER — OXYCODONE-ACETAMINOPHEN 10-325 MG PO TABS: ORAL_TABLET | ORAL | 0 refills | Status: DC

## 2019-05-11 NOTE — Telephone Encounter (Signed)
Pt states that medicaid will not approve 90 days of medication that needed PA and she needs it re written for 30 days

## 2019-05-11 NOTE — Telephone Encounter (Signed)
Spoke with patient and she let me know that insurance is denying her oxycodone - apap because of the qty and that it states that it is to last x 90 days.  She can only be written for 30 days, which patient states will last her 3 months because she only takes 0.5 tablet at a time.  I told her I would give this message to Dr Holley Raring.

## 2019-05-31 ENCOUNTER — Other Ambulatory Visit
Admission: RE | Admit: 2019-05-31 | Discharge: 2019-05-31 | Disposition: A | Discharge status: Home / Self Care | Payer: Medicaid Other | Source: Ambulatory Visit | Attending: Student in an Organized Health Care Education/Training Program | Admitting: Student in an Organized Health Care Education/Training Program

## 2019-06-05 ENCOUNTER — Ambulatory Visit: Payer: Medicaid Other | Admitting: Student in an Organized Health Care Education/Training Program

## 2019-07-04 ENCOUNTER — Encounter: Payer: Self-pay | Admitting: Student in an Organized Health Care Education/Training Program

## 2019-07-04 ENCOUNTER — Other Ambulatory Visit: Payer: Self-pay

## 2019-07-04 ENCOUNTER — Ambulatory Visit
Payer: Medicaid Other | Attending: Student in an Organized Health Care Education/Training Program | Admitting: Student in an Organized Health Care Education/Training Program

## 2019-07-04 DIAGNOSIS — M25562 Pain in left knee: Secondary | ICD-10-CM

## 2019-07-04 DIAGNOSIS — G8929 Other chronic pain: Secondary | ICD-10-CM

## 2019-07-04 NOTE — Progress Notes (Signed)
I attempted to call the patient however no response. Voicemail left instructing patient to call front desk office at [phone] to reschedule appointment. -Dr Noemi Ishmael  

## 2019-07-28 ENCOUNTER — Other Ambulatory Visit: Payer: Self-pay | Admitting: Student in an Organized Health Care Education/Training Program

## 2019-07-28 DIAGNOSIS — G8929 Other chronic pain: Secondary | ICD-10-CM

## 2019-08-02 ENCOUNTER — Encounter: Payer: Self-pay | Admitting: Student in an Organized Health Care Education/Training Program

## 2019-08-03 ENCOUNTER — Ambulatory Visit
Payer: Medicaid Other | Attending: Student in an Organized Health Care Education/Training Program | Admitting: Student in an Organized Health Care Education/Training Program

## 2019-08-03 ENCOUNTER — Telehealth: Payer: Self-pay | Admitting: *Deleted

## 2019-08-03 ENCOUNTER — Encounter: Payer: Self-pay | Admitting: Student in an Organized Health Care Education/Training Program

## 2019-08-03 ENCOUNTER — Other Ambulatory Visit: Payer: Self-pay

## 2019-08-03 DIAGNOSIS — G8929 Other chronic pain: Secondary | ICD-10-CM

## 2019-08-03 DIAGNOSIS — M5416 Radiculopathy, lumbar region: Secondary | ICD-10-CM | POA: Diagnosis not present

## 2019-08-03 MED ORDER — OXYCODONE-ACETAMINOPHEN 10-325 MG PO TABS: ORAL_TABLET | ORAL | 0 refills | Status: DC

## 2019-08-03 MED ORDER — GABAPENTIN 300 MG PO CAPS: 300.0000 mg | ORAL_CAPSULE | Freq: Three times a day (TID) | ORAL | 0 refills | Status: DC

## 2019-08-03 NOTE — Progress Notes (Signed)
Pain Management Virtual Encounter Note - Virtual Visit via [place] (real-time audio visits between healthcare provider and patient).   Patient's Phone No. & Preferred Pharmacy:  [phone] (home); [phone] (mobile); (Preferred) [phone] No e-mail address on record  [id number] [place], [place] [address] [place] [address] Phone: [phone] Fax: [place] [address]), [place] - [place]) [place] [address] Phone: [phone] Fax: [phone]    Pre-screening note:  Our staff contacted Terri Jennings and offered her an "in person", "face-to-face" appointment versus a telephone encounter. She indicated preferring the telephone encounter, at this time.   Reason for Virtual Visit: COVID-19*  Social distancing based on CDC and AMA recommendations.   I contacted Terri Jennings on 08/03/2019 via video conference.      I clearly identified myself as Gillis Santa, MD. I verified that I was speaking with the correct person using two identifiers (Name: Terri Jennings, and date of birth: 26-Nov-1956).  Advanced Informed Consent I sought verbal advanced consent from Terri Jennings for virtual visit interactions. I informed Terri Jennings of possible security and privacy concerns, risks, and limitations associated with providing "not-in-person" medical evaluation and management services. I also informed Terri Jennings of the availability of "in-person" appointments. Finally, I informed her that there would be a charge for the virtual visit and that she could be  personally, fully or partially, financially responsible for it. Terri Jennings expressed understanding and agreed to proceed.   Historic Elements   Terri Jennings is a 63 y.o. year old, female patient evaluated today after her last encounter by our practice on  07/28/2019. Terri Jennings  has a past medical history of Arthritis, Dysrhythmia, Enlarged heart, and Hypertension. She also  has a past surgical history that includes Knee surgery (Right, 2009) and Cataract extraction w/PHACO (Right, 03/18/2017). Terri Jennings has a current medication list which includes the following prescription(s): amlodipine besylate, atropine, fenofibrate, lisinopril, oxycodone-acetaminophen, prednisolone acetate, rosuvastatin, and gabapentin. She  reports that she has never smoked. She has never used smokeless tobacco. She reports that she does not drink alcohol or use drugs. Terri Jennings is allergic to morphine and related; oxycontin [oxycodone hcl]; tramadol; and hydrocodone.   HPI  Today, she is being contacted for medication management.   No change in medical history since last visit.  Patient's pain is at baseline.  Patient continues multimodal pain regimen as prescribed.  States that it provides pain relief and improvement in functional status.   Pharmacotherapy Assessment  Analgesic  05/11/2019  1   05/11/2019  Oxycodone-Acetaminophen 10-325  135.00 23 Bi Lat   [id number]   Nor (4575)   0  88.04 MME  Medicaid   [place]    Monitoring: Pharmacotherapy: No side-effects or adverse reactions reported. [place] PMP: PDMP reviewed during this encounter.       Compliance: No problems identified. Effectiveness: Clinically acceptable. Plan: Refer to "POC".  UDS:  Summary  Date Value Ref Range Status  10/24/2018 FINAL  Final    Comment:    ==================================================================== TOXASSURE SELECT 13 (MW) ==================================================================== Test                             Result       Flag       Units Drug Present and Declared for Prescription Verification   Oxycodone  1084         EXPECTED   ng/mg creat   Oxymorphone                    444          EXPECTED   ng/mg creat   Noroxycodone                    879          EXPECTED   ng/mg creat   Noroxymorphone                 60           EXPECTED   ng/mg creat    Sources of oxycodone are scheduled prescription medications.    Oxymorphone, noroxycodone, and noroxymorphone are expected    metabolites of oxycodone. Oxymorphone is also available as a    scheduled prescription medication. ==================================================================== Test                      Result    Flag   Units      Ref Range   Creatinine              148              mg/dL      >=62>=20 ==================================================================== Declared Medications:  The flagging and interpretation on this report are based on the  following declared medications.  Unexpected results may arise from  inaccuracies in the declared medications.  **Note: The testing scope of this panel includes these medications:  Oxycodone  **Note: The testing scope of this panel does not include following  reported medications:  Amlodipine Besylate  Atropine  Diclofenac  Fenofibrate  Gabapentin  Lisinopril  Prednisolone  Rosuvastatin ==================================================================== For clinical consultation, please call [phone]. ====================================================================     Assessment  The encounter diagnosis was Chronic radicular low back pain.  Plan of Care  I am having Terri Jennings maintain her lisinopril, AMLODIPINE BESYLATE PO, atropine, prednisoLONE acetate, rosuvastatin, fenofibrate, gabapentin, and oxyCODONE-acetaminophen.  -UDS AT NEXT VISIT  Pharmacotherapy (Medications Ordered): Meds ordered this encounter  Medications  . gabapentin (NEURONTIN) 300 MG capsule    Sig: Take 1 capsule (300 mg total) by mouth 3 (three) times daily.    Dispense:  270 capsule    Refill:  0  . oxyCODONE-acetaminophen (PERCOCET) 10-325 MG tablet    Sig: 1 tablet every 4-6 hrs as needed for severe  pain.    Dispense:  135 tablet    Refill:  0    Chronic Pain. (STOP Act - Not applicable). Fill one day early if closed on scheduled refill date.   Follow-up plan:   Return in about 3 months (around 11/02/2019) for Medication Management, in person (NEED UDS).    Recent Visits Date Type Provider Dept  07/04/19 Office Visit Edward JollyLateef, Brandyn Lowrey, MD [facility]  Showing recent visits within past 90 days and meeting all other requirements   Today's Visits Date Type Provider Dept  08/03/19 Office Visit Edward JollyLateef, Katianne Barre, MD [facility]  Showing today's visits and meeting all other requirements   Future Appointments No visits were found meeting these conditions.  Showing future appointments within next 90 days and meeting all other requirements   I discussed the assessment and treatment plan with the patient. The patient was provided an opportunity to ask questions and all were answered. The patient agreed with the plan  and demonstrated an understanding of the instructions.  Patient advised to call back or seek an in-person evaluation if the symptoms or condition worsens.  Total duration of non-face-to-face encounter:25 minutes.  Note by: Edward Jolly, MD Date: 08/03/2019; Time: 12:27 PM  Note: This dictation was prepared with Dragon dictation. Any transcriptional errors that may result from this process are unintentional.  Disclaimer:  * Given the special circumstances of the COVID-19 pandemic, the federal government has announced that the Office for Civil Rights (OCR) will exercise its enforcement discretion and will not impose penalties on physicians using telehealth in the event of noncompliance with regulatory requirements under the [employer] Portability and Accountability Act (HIPAA) in connection with the good faith provision of telehealth during the COVID-19 national public health emergency. (AMA)

## 2019-08-04 ENCOUNTER — Telehealth: Payer: Self-pay | Admitting: Student in an Organized Health Care Education/Training Program

## 2019-08-04 NOTE — Telephone Encounter (Signed)
Her meds were called for 90 days but her insurance will only pay for 30 days per script. Please change meds script to 30 days. And let patient know. I did inform patient the physician is not here today but we would try to reach him and see if he could change.

## 2019-08-08 NOTE — Telephone Encounter (Signed)
[facility] to fill tomorrow is to last 30 days.  Qty 135 sig 1 tablet every 4-6 hours with the understanding that she will not take every day at 4 hours and the qty of 135 is to last.  June Rx for hydrocodone - apap sig every 8 hours to be destroyed.

## 2019-08-09 ENCOUNTER — Telehealth: Payer: Self-pay | Admitting: Student in an Organized Health Care Education/Training Program

## 2019-08-09 NOTE — Telephone Encounter (Signed)
Patient states she called pharmacy to pick up meds and was told they need our office to call and verify a number. Patient could not remember what number.

## 2019-08-09 NOTE — Telephone Encounter (Signed)
Pharmacy needed an ICD10 code due to MME dose. Given.

## 2019-10-20 IMAGING — US US EXTREM LOW VENOUS*L*
1 series · 13 of 24 positions shown · non-contrast
Comparison: None.

CLINICAL DATA: Left lower extremity pain after injury today.



[Series 1: us extrem low venous*left* · 0.10mm/px · 13 of 35 slices shown]
[im 1/35]
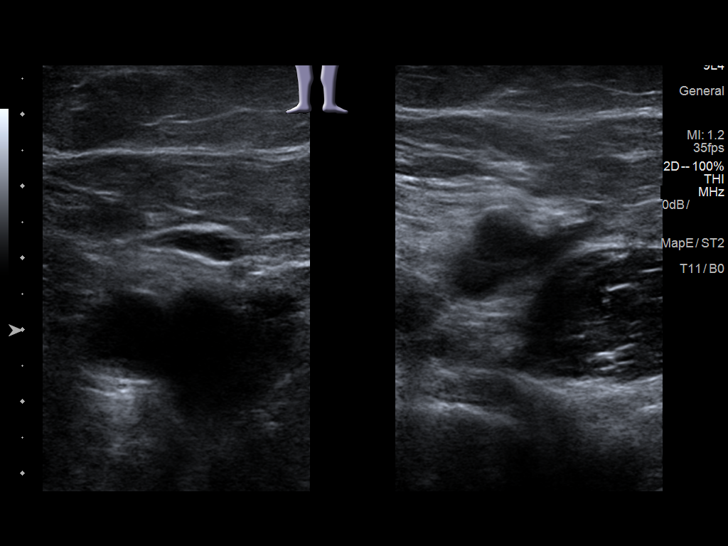
[im 3/35]
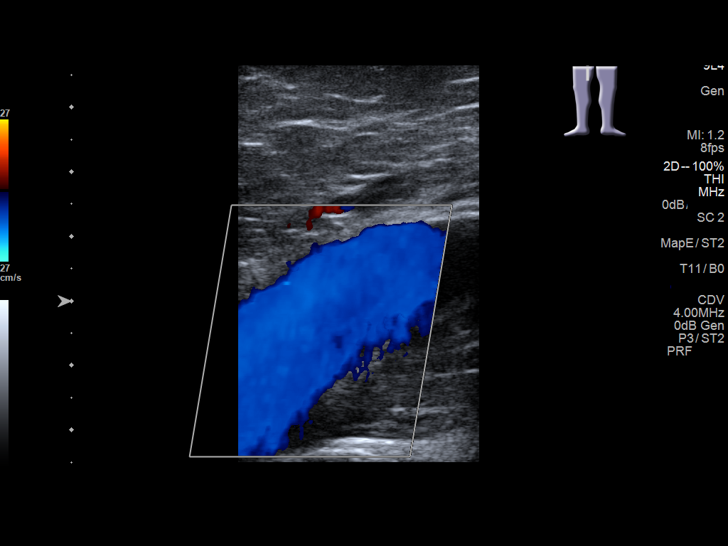
[im 6/35]
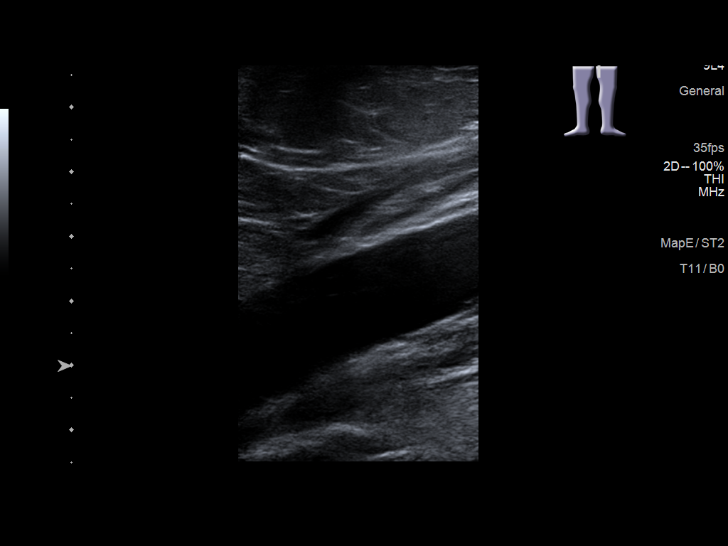
[im 9/35]
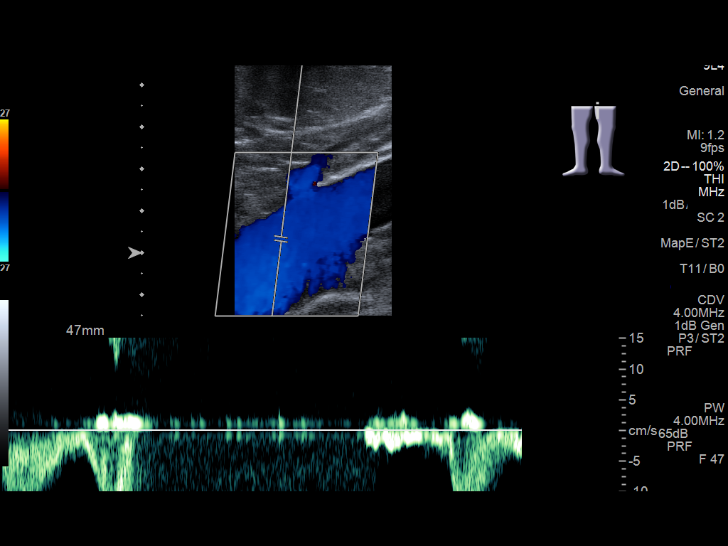
[im 12/35]
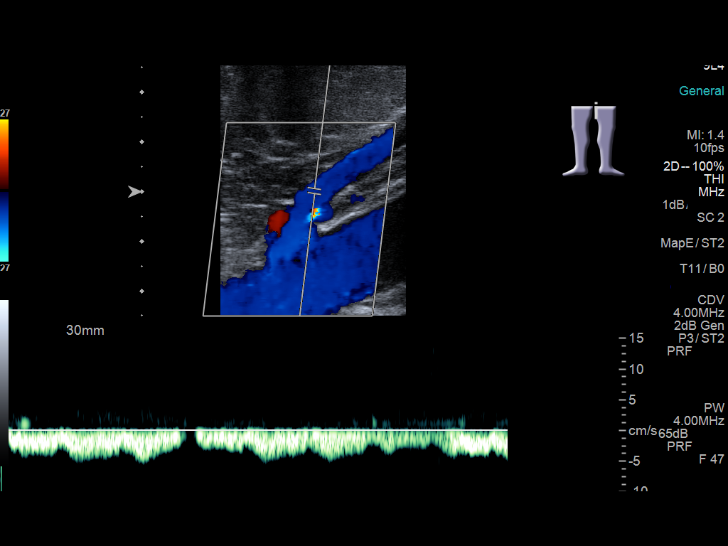
[im 15/35]
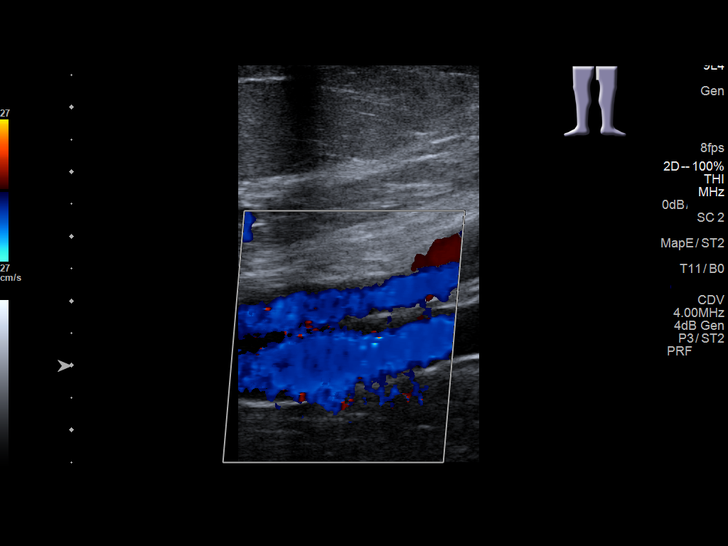
[im 18/35]
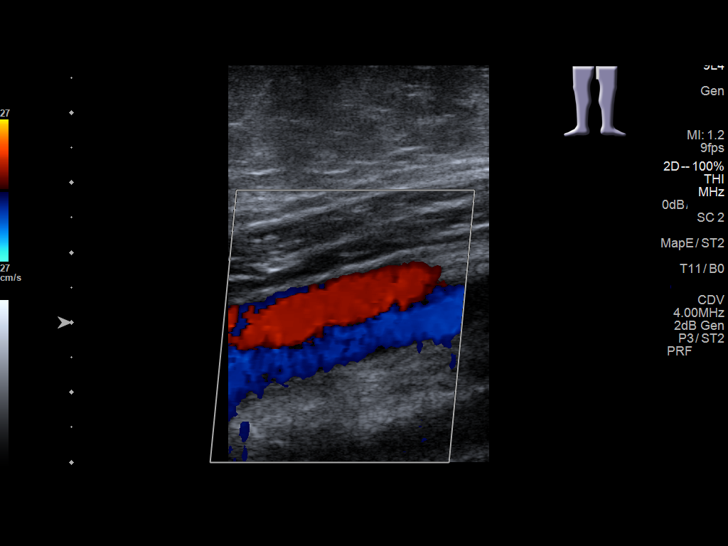
[im 20/35]
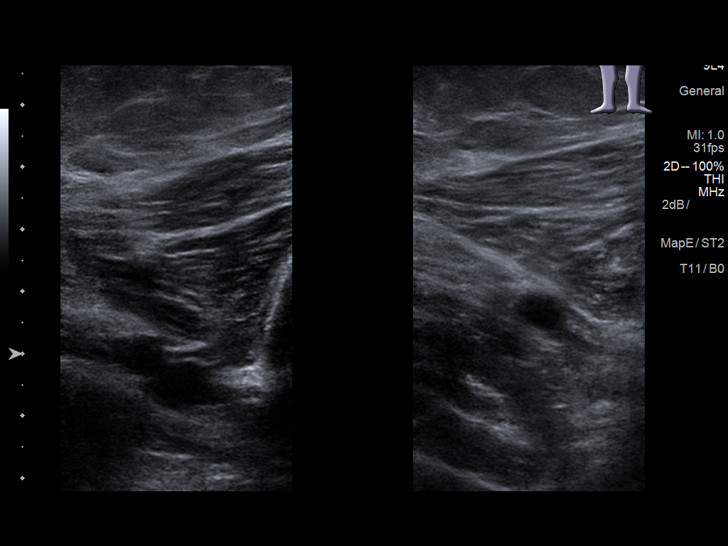
[im 23/35]
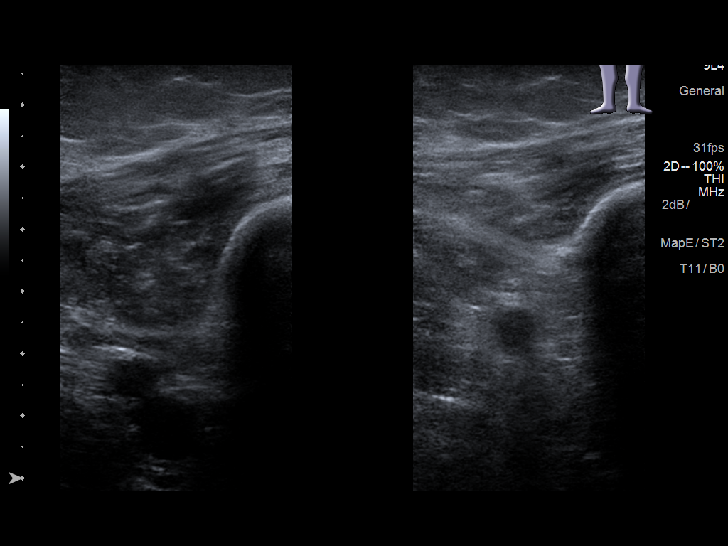
[im 26/35]
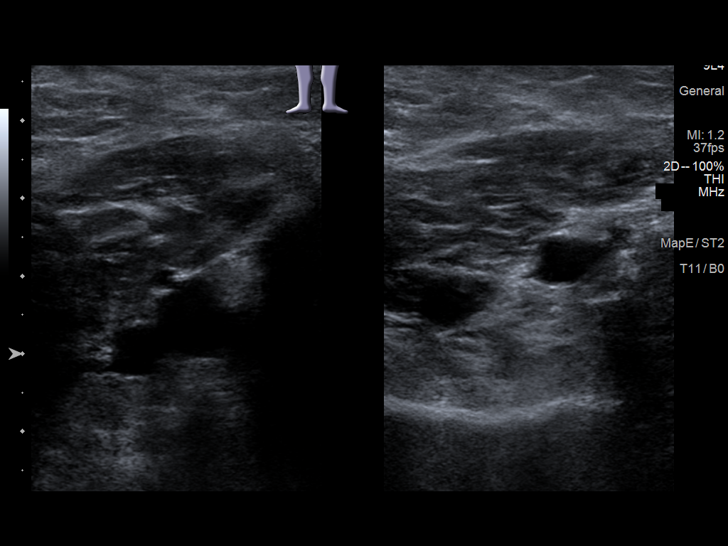
[im 29/35]
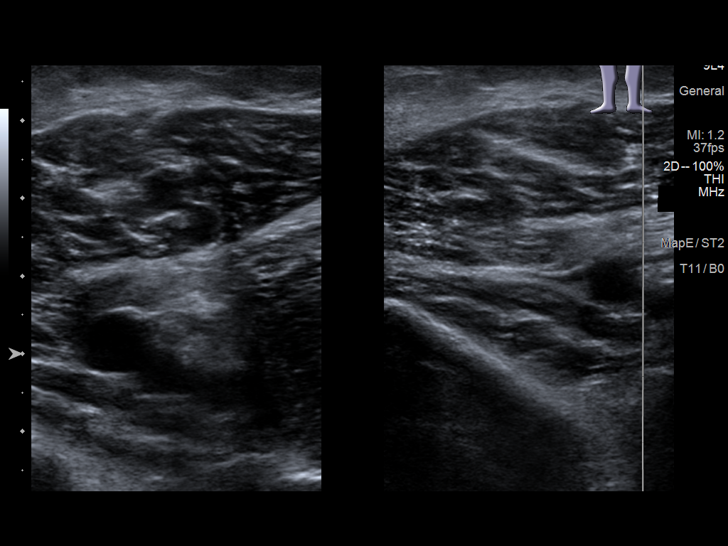
[im 32/35]
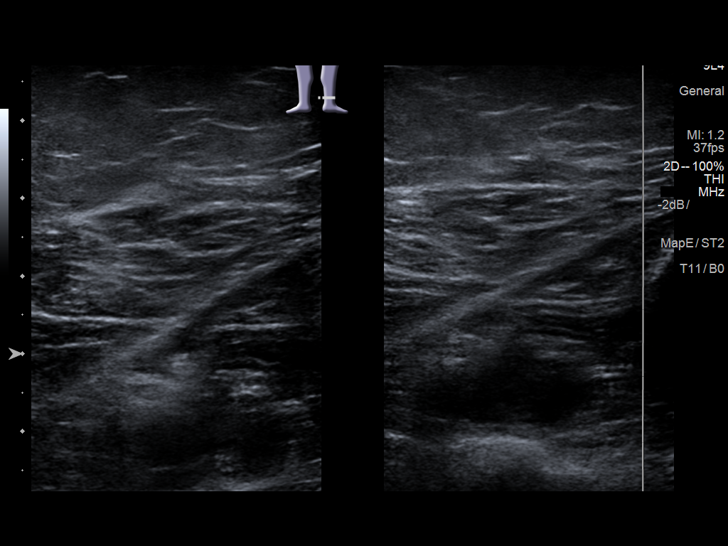
[im 35/35]
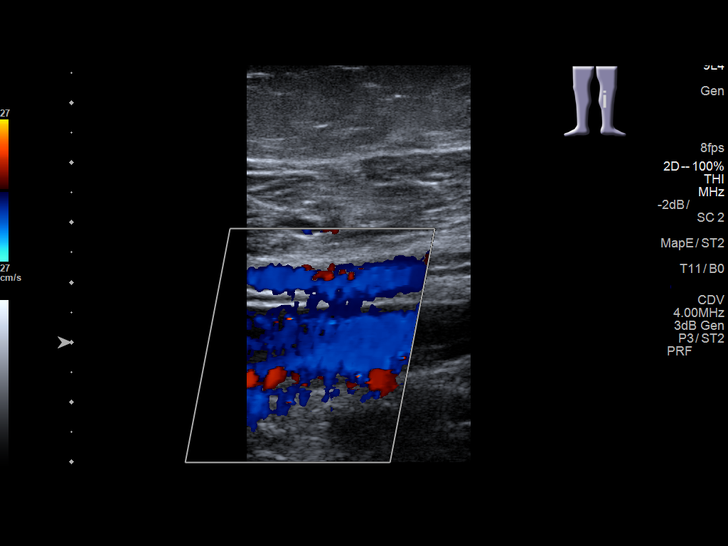

[13 of 24 positions shown; findings below may reference images not displayed]

FINDINGS: Contralateral Common Femoral Vein: Respiratory phasicity is normal
and symmetric with the symptomatic side. No evidence of thrombus.
Normal compressibility.

Common Femoral Vein: No evidence of thrombus. Normal
compressibility, respiratory phasicity and response to augmentation.

Saphenofemoral Junction: No evidence of thrombus. Normal
compressibility and flow on color Doppler imaging.

Profunda Femoral Vein: No evidence of thrombus. Normal
compressibility and flow on color Doppler imaging.

Femoral Vein: No evidence of thrombus. Normal compressibility,
respiratory phasicity and response to augmentation.

Popliteal Vein: No evidence of thrombus. Normal compressibility,
respiratory phasicity and response to augmentation.

Calf Veins: No evidence of thrombus. Normal compressibility and flow
on color Doppler imaging.

Superficial Great Saphenous Vein: No evidence of thrombus. Normal
compressibility.

Venous Reflux:  None.

Other Findings:  None.
IMPRESSION: No evidence of deep venous thrombosis in the left lower extremity.

## 2019-11-02 ENCOUNTER — Encounter: Payer: Medicaid Other | Admitting: Student in an Organized Health Care Education/Training Program

## 2019-11-13 ENCOUNTER — Ambulatory Visit
Payer: Self-pay | Attending: Student in an Organized Health Care Education/Training Program | Admitting: Student in an Organized Health Care Education/Training Program

## 2019-11-13 ENCOUNTER — Encounter: Payer: Self-pay | Admitting: Student in an Organized Health Care Education/Training Program

## 2019-11-13 ENCOUNTER — Other Ambulatory Visit: Payer: Self-pay

## 2019-11-13 DIAGNOSIS — Z9889 Other specified postprocedural states: Secondary | ICD-10-CM

## 2019-11-13 DIAGNOSIS — G894 Chronic pain syndrome: Secondary | ICD-10-CM

## 2019-11-13 DIAGNOSIS — M5416 Radiculopathy, lumbar region: Secondary | ICD-10-CM

## 2019-11-13 DIAGNOSIS — G8929 Other chronic pain: Secondary | ICD-10-CM

## 2019-11-13 DIAGNOSIS — M1711 Unilateral primary osteoarthritis, right knee: Secondary | ICD-10-CM

## 2019-11-13 DIAGNOSIS — M25561 Pain in right knee: Secondary | ICD-10-CM

## 2019-11-13 DIAGNOSIS — M25562 Pain in left knee: Secondary | ICD-10-CM

## 2019-11-13 MED ORDER — OXYCODONE-ACETAMINOPHEN 10-325 MG PO TABS: ORAL_TABLET | ORAL | 0 refills | Status: DC

## 2019-11-13 NOTE — Progress Notes (Signed)
Pain Management Virtual Encounter Note - Virtual Visit via [place] (real-time audio visits between healthcare provider and patient).   Patient's Phone No. & Preferred Pharmacy:  [phone] (home); [phone] (mobile); (Preferred) [phone] No e-mail address on record  [id number] [place], [place] - [address] [place] [address] Phone: [phone] Fax: [place] [address]), [place] - [place]) [place] [address] Phone: [phone] Fax: [phone]    Pre-screening note:  Our staff contacted Terri Jennings and offered her an "in person", "face-to-face" appointment versus a telephone encounter. She indicated preferring the telephone encounter, at this time.   Reason for Virtual Visit: COVID-19*  Social distancing based on CDC and AMA recommendations.   I contacted Terri Jennings on 11/13/2019 via video conference.      I clearly identified myself as Terri Santa, MD. I verified that I was speaking with the correct person using two identifiers (Name: Terri Jennings, and date of birth: 22-Nov-1956).  Advanced Informed Consent I sought verbal advanced consent from Terri Jennings for virtual visit interactions. I informed Terri Jennings of possible security and privacy concerns, risks, and limitations associated with providing "not-in-person" medical evaluation and management services. I also informed Terri Jennings of the availability of "in-person" appointments. Finally, I informed her that there would be a charge for the virtual visit and that she could be  personally, fully or partially, financially responsible for it. Terri Jennings expressed understanding and agreed to proceed.   Historic Elements   Terri Jennings is a 63 y.o. year old, female patient evaluated today after her last encounter by our practice on  08/09/2019. Terri Jennings  has a past medical history of Arthritis, Dysrhythmia, Enlarged heart, and Hypertension. She also  has a past surgical history that includes Knee surgery (Right, 2009) and Cataract extraction w/PHACO (Right, 03/18/2017). Terri Jennings has a current medication list which includes the following prescription(s): amlodipine besylate, atropine, fenofibrate, lisinopril, oxycodone-acetaminophen, prednisolone acetate, gabapentin, and rosuvastatin. She  reports that she has never smoked. She has never used smokeless tobacco. She reports that she does not drink alcohol or use drugs. Terri Jennings is allergic to morphine and related; oxycontin [oxycodone hcl]; tramadol; and hydrocodone.   HPI  Today, she is being contacted for medication management.   No change in medical history since last visit.  Patient's pain is at baseline.  Patient continues multimodal pain regimen as prescribed.  States that it provides pain relief and improvement in functional status.  Pharmacotherapy Assessment  Analgesic: 08/09/2019  1   08/03/2019  Oxycodone-Acetaminophen 10-325  135.00  23 Bi Lat   [id number]   Nor (4575)   0  88.04 MME  Medicaid   [place]     Monitoring: Pharmacotherapy: No side-effects or adverse reactions reported. [place] PMP: PDMP reviewed during this encounter.       Compliance: No problems identified. Effectiveness: Clinically acceptable. Plan: Refer to "POC".  UDS:  Summary  Date Value Ref Range Status  10/24/2018 FINAL  Final    Comment:    ==================================================================== TOXASSURE SELECT 13 (MW) ==================================================================== Test                             Result       Flag       Units Drug Present and Declared for Prescription Verification   Oxycodone  1084         EXPECTED   ng/mg creat   Oxymorphone                    444          EXPECTED   ng/mg creat   Noroxycodone                    879          EXPECTED   ng/mg creat   Noroxymorphone                 60           EXPECTED   ng/mg creat    Sources of oxycodone are scheduled prescription medications.    Oxymorphone, noroxycodone, and noroxymorphone are expected    metabolites of oxycodone. Oxymorphone is also available as a    scheduled prescription medication. ==================================================================== Test                      Result    Flag   Units      Ref Range   Creatinine              148              mg/dL      >=16>=20 ==================================================================== Declared Medications:  The flagging and interpretation on this report are based on the  following declared medications.  Unexpected results may arise from  inaccuracies in the declared medications.  **Note: The testing scope of this panel includes these medications:  Oxycodone  **Note: The testing scope of this panel does not include following  reported medications:  Amlodipine Besylate  Atropine  Diclofenac  Fenofibrate  Gabapentin  Lisinopril  Prednisolone  Rosuvastatin ==================================================================== For clinical consultation, please call [phone]. ====================================================================    Laboratory Chemistry Profile (12 mo)  Renal: No results found for requested labs within last 8760 hours.  No results found for: GFR, GFRAA, GFRNONAA Hepatic: No results found for requested labs within last 8760 hours. No results found for: AST, ALT Other: No results found for requested labs within last 8760 hours. Note: Above Lab results reviewed.  Imaging  MR KNEE LEFT WO CONTRAST CLINICAL DATA:  Left knee pain since May 2019.  EXAM: MRI OF THE LEFT KNEE WITHOUT CONTRAST  TECHNIQUE: Multiplanar, multisequence MR imaging of the knee was performed. No intravenous contrast was administered.  COMPARISON:   None.  FINDINGS: MENISCI  Medial meniscus: Radial tear of the posterior horn of the medial meniscus with peripheral meniscal extrusion. Small undersurface tear of the body of the medial meniscus.  Lateral meniscus:  Intact.  LIGAMENTS  Cruciates:  Intact ACL and PCL.  Collaterals: Medial collateral ligament is intact. Lateral collateral ligament complex is intact.  CARTILAGE  Patellofemoral: Partial-thickness cartilage loss of the patellar apex with minimal subchondral reactive marrow changes. High-grade partial-thickness cartilage loss with areas of full-thickness cartilage loss of the trochlear groove with subchondral reactive marrow changes.  Medial: High-grade partial-thickness cartilage loss with areas of full-thickness cartilage loss of the medial femoral condyle and medial tibial plateau. Osteochondral lesion involving the weight-bearing surface of the medial femoral condyle measuring 10 x 23 mm with cystic changes and surrounding marrow edema at the OCL-bone interface.  Lateral:  No chondral defect.  Joint: Small joint effusion. Normal Hoffa's fat. No plical thickening.  Popliteal Fossa:  Tiny Baker's cyst.  Intact popliteus tendon.  Extensor Mechanism:  Intact quadriceps tendon. Intact patellar tendon. Intact medial patellar retinaculum. Intact lateral patellar retinaculum. Intact MPFL.  Bones:  No acute osseous abnormality.  No aggressive osseous lesion.  Other: No fluid collection or hematoma.  IMPRESSION: 1. Radial tear of the posterior horn of the medial meniscus with peripheral meniscal extrusion. Small undersurface tear of the body of the medial meniscus. 2. Partial-thickness cartilage loss of the patellar apex with minimal subchondral reactive marrow changes. High-grade partial-thickness cartilage loss with areas of full-thickness cartilage loss of the trochlear groove with subchondral reactive marrow changes. 3. High-grade partial-thickness  cartilage loss with areas of full-thickness cartilage loss of the medial femoral condyle and medial tibial plateau. Osteochondral lesion involving the weight-bearing surface of the medial femoral condyle measuring 10 x 23 mm with cystic changes and surrounding marrow edema at the OCL-bone interface.  Electronically Signed   By: Elige Ko   On: 08/09/2018 11:58   Assessment  The primary encounter diagnosis was Chronic radicular low back pain. Diagnoses of Chronic pain of both knees (L>R), Chronic pain of left knee, H/O right knee surgery, Primary osteoarthritis of right knee, Chronic pain of right knee, and Chronic pain syndrome were also pertinent to this visit.  Plan of Care   I am having Terri Jennings maintain her lisinopril, AMLODIPINE BESYLATE PO, atropine, prednisoLONE acetate, rosuvastatin, fenofibrate, gabapentin, and oxyCODONE-acetaminophen.  Pharmacotherapy (Medications Ordered): Meds ordered this encounter  Medications  . oxyCODONE-acetaminophen (PERCOCET) 10-325 MG tablet    Sig: 1 tablet every 4-6 hrs as needed for severe pain.    Dispense:  135 tablet    Refill:  0    Chronic Pain. (STOP Act - Not applicable). Fill one day early if closed on scheduled refill date.   Orders:  Orders Placed This Encounter  Procedures  . UDS (Compliance-13) ([facility]) ([facility]) (Established Pt.)    Volume: 30 ml(s). Minimum 3 ml of urine is needed. Document temperature of fresh sample. Indications: Long term (current) use of opiate analgesic (R42.706)   Follow-up plan:   Return in about 3 months (around 02/11/2020) for Medication Management.    Recent Visits No visits were found meeting these conditions.  Showing recent visits within past 90 days and meeting all other requirements   Today's Visits Date Type Provider Dept  11/13/19 Office Visit Edward Jolly, MD [facility]  Showing today's visits and meeting all other requirements   Future Appointments No  visits were found meeting these conditions.  Showing future appointments within next 90 days and meeting all other requirements   I discussed the assessment and treatment plan with the patient. The patient was provided an opportunity to ask questions and all were answered. The patient agreed with the plan and demonstrated an understanding of the instructions.  Patient advised to call back or seek an in-person evaluation if the symptoms or condition worsens.  Total duration of non-face-to-face encounter: 25 minutes.  Note by: Edward Jolly, MD Date: 11/13/2019; Time: 12:47 PM  Note: This dictation was prepared with Dragon dictation. Any transcriptional errors that may result from this process are unintentional.

## 2019-11-14 ENCOUNTER — Encounter: Payer: Medicaid Other | Admitting: Student in an Organized Health Care Education/Training Program

## 2019-11-14 ENCOUNTER — Other Ambulatory Visit: Payer: Self-pay | Admitting: Student in an Organized Health Care Education/Training Program

## 2019-11-14 DIAGNOSIS — G8929 Other chronic pain: Secondary | ICD-10-CM

## 2019-11-15 ENCOUNTER — Telehealth: Payer: Self-pay

## 2019-11-15 NOTE — Telephone Encounter (Signed)
Patient notified that she may come to the office after being screened at front door. Also confirmed that she needs PA for Oxycodone.

## 2019-11-15 NOTE — Telephone Encounter (Signed)
She called to let us know she is coming tomorrow after 12 to do her labs and we need to call down there to the front desk or they wont let her come up. Also, she states that her medicines need prior authorization.

## 2019-11-18 LAB — TOXASSURE SELECT 13 (MW), URINE

## 2019-11-20 NOTE — Telephone Encounter (Signed)
Patient called in to see if Dr. Holley Raring had called out her gabapentin yet.

## 2020-02-07 ENCOUNTER — Telehealth: Payer: Self-pay | Admitting: *Deleted

## 2020-02-07 ENCOUNTER — Encounter: Payer: Self-pay | Admitting: Student in an Organized Health Care Education/Training Program

## 2020-02-07 ENCOUNTER — Telehealth: Payer: Self-pay

## 2020-02-07 NOTE — Telephone Encounter (Signed)
LM to call us for pre virtual appointment questions 

## 2020-02-07 NOTE — Telephone Encounter (Signed)
Pt returning call from nurse, Please call her back.AT

## 2020-02-07 NOTE — Telephone Encounter (Signed)
Attempted to call for pre appointment review of allergies/meds. No answer. Unable to leave a message.

## 2020-02-08 ENCOUNTER — Encounter: Payer: Self-pay | Admitting: Student in an Organized Health Care Education/Training Program

## 2020-02-08 ENCOUNTER — Ambulatory Visit
Payer: Medicaid Other | Attending: Student in an Organized Health Care Education/Training Program | Admitting: Student in an Organized Health Care Education/Training Program

## 2020-02-08 ENCOUNTER — Other Ambulatory Visit: Payer: Self-pay

## 2020-02-08 DIAGNOSIS — G894 Chronic pain syndrome: Secondary | ICD-10-CM | POA: Diagnosis not present

## 2020-02-08 DIAGNOSIS — M5416 Radiculopathy, lumbar region: Secondary | ICD-10-CM | POA: Diagnosis not present

## 2020-02-08 DIAGNOSIS — M25562 Pain in left knee: Secondary | ICD-10-CM

## 2020-02-08 DIAGNOSIS — Z79891 Long term (current) use of opiate analgesic: Secondary | ICD-10-CM

## 2020-02-08 DIAGNOSIS — M25552 Pain in left hip: Secondary | ICD-10-CM

## 2020-02-08 DIAGNOSIS — M25561 Pain in right knee: Secondary | ICD-10-CM

## 2020-02-08 DIAGNOSIS — Z9889 Other specified postprocedural states: Secondary | ICD-10-CM

## 2020-02-08 DIAGNOSIS — G8929 Other chronic pain: Secondary | ICD-10-CM

## 2020-02-08 DIAGNOSIS — M1711 Unilateral primary osteoarthritis, right knee: Secondary | ICD-10-CM

## 2020-02-08 MED ORDER — GABAPENTIN 300 MG PO CAPS: 300.0000 mg | ORAL_CAPSULE | Freq: Three times a day (TID) | ORAL | 2 refills | Status: DC

## 2020-02-08 MED ORDER — OXYCODONE-ACETAMINOPHEN 10-325 MG PO TABS: ORAL_TABLET | ORAL | 0 refills | Status: DC

## 2020-02-08 NOTE — Progress Notes (Signed)
Patient: Terri Jennings  Service Category: Terri/M  Provider: Gillis Santa, MD  DOB: Apr 16, 1956  DOS: 02/08/2020  Location: Office  MRN: [id number]  Setting: Ambulatory outpatient  Referring Provider: [facility] [place]*  Type: Established Patient  Specialty: Interventional Pain Management  PCP: [facility] [place]  Location: Home  Delivery: TeleHealth     Virtual Encounter - Pain Management PROVIDER NOTE: Information contained herein reflects review and annotations entered in association with encounter. Interpretation of such information and data should be left to medically-trained personnel. Information provided to patient can be located elsewhere in the medical record under "Patient Instructions". Document created using STT-dictation technology, any transcriptional errors that may result from process are unintentional.    Contact & Pharmacy Preferred: [phone] Home: [phone] (home) Mobile: [phone] (mobile) Terri-mail: No Terri-mail address on record  [id number] [address] [place]Phone: [phone]: [address]), [place]- [place] [address]Phone: [phone]: [phone]  Pre-screening  Terri Jennings offered "in-person" vs "virtual" encounter. She indicated preferring virtual for this encounter.   Reason COVID-19*  Social distancing based on CDC and AMA recommendations.   I contacted ERonald Jennings on 02/08/2020 via telephone.      I clearly identified myself as Terri Santa MD. I verified that I was speaking with the correct person using two identifiers (Name: Terri Jennings and date of birth: [birth year].  Consent I sought verbal advanced consent from EJunie Bamefor virtual visit interactions. I informed Terri Jennings of possible security and  privacy concerns, risks, and limitations associated with providing "not-in-person" medical evaluation and management services. I also informed Terri Jennings of the availability of "in-person" appointments. Finally, I informed her that there would be a charge for the virtual visit and that she could be  personally, fully or partially, financially responsible for it. [name] understanding and agreed to proceed.   Historic Elements   Terri Jennings Twiningis a 64y.o. year old, female patient evaluated today after her last contact with our practice on 02/07/2020. Ms. CBrose has a past medical history of Arthritis, Dysrhythmia, Enlarged heart, and Hypertension. She also  has a past surgical history that includes Knee surgery (Right, 2009) and Cataract extraction w/PHACO (Right, 03/18/2017). [name] a current medication list which includes the following prescription(s): amlodipine besylate, atropine, fenofibrate, gabapentin, lisinopril, [START ON 02/09/2020] oxycodone-acetaminophen, prednisolone acetate, and rosuvastatin. She  reports that she has never smoked. She has never used smokeless tobacco. She reports that she does not drink alcohol or use drugs. [name] allergic to morphine and related; oxycontin [oxycodone hcl]; tramadol; and hydrocodone.   HPI  Today, she is being contacted for medication management.   No change in medical history since last visit.  Patient's pain is at baseline.  Patient continues multimodal pain regimen as prescribed.  States that it provides pain relief and improvement in functional status.   Pharmacotherapy Assessment  Analgesic: 11/16/2019  1   11/13/2019  Oxycodone-Acetaminophen 10-325  135.00  23 Bi Lat   [id number]  Nor (4575)   0  88.04 MME  Medicaid   N    Of note 1 rx last for 3 months  Monitoring: [place] PMP: PDMP reviewed during this encounter.       Pharmacotherapy: No side-effects or adverse reactions reported. Compliance:  No  problems identified. Effectiveness: Clinically acceptable. Plan: Refer to "POC".  UDS:  Summary  Date Value Ref Range Status  11/16/2019 Note  Final    Comment:    ==================================================================== ToxASSURE Select 13 (MW) ==================================================================== Test                             Result       Flag       Units Drug Absent but Declared for Prescription Verification   Oxycodone                      Not Detected UNEXPECTED ng/mg creat ==================================================================== Test                      Result    Flag   Units      Ref Range   Creatinine              129              mg/dL      >=20 ==================================================================== Declared Medications:  The flagging and interpretation on this report are based on the  following declared medications.  Unexpected results may arise from  inaccuracies in the declared medications.  **Note: The testing scope of this panel includes these medications:  Oxycodone  **Note: The testing scope of this panel does not include the  following reported medications:  Acetaminophen  Amlodipine  Atropine  Fenofibrate  Gabapentin  Lisinopril  Prednisolone  Rosuvastatin ==================================================================== For clinical consultation, please call [phone]. ====================================================================    Laboratory Chemistry Profile   Renal Lab Results  Component Value Date   LABCREA 148.41 01/22/2015    Hepatic No results found for: AST, ALT, ALBUMIN, ALKPHOS, HCVAB, AMYLASE, LIPASE, AMMONIA  Electrolytes No results found for: NA, K, CL, CALCIUM, MG, PHOS  Bone No results found for: VD25OH, [id number], XT0240XB3, ZH2992EQ6, 25OHVITD1, 25OHVITD2, 25OHVITD3, TESTOFREE, TESTOSTERONE  Inflammation (CRP: Acute Phase) (ESR: Chronic Phase) No results  found for: CRP, ESRSEDRATE, LATICACIDVEN    Note: Above Lab results reviewed.  Imaging  MR KNEE LEFT WO CONTRAST CLINICAL DATA:  Left knee pain since May 2019.  EXAM: MRI OF THE LEFT KNEE WITHOUT CONTRAST  TECHNIQUE: Multiplanar, multisequence MR imaging of the knee was performed. No intravenous contrast was administered.  COMPARISON:  None.  FINDINGS: MENISCI  Medial meniscus: Radial tear of the posterior horn of the medial meniscus with peripheral meniscal extrusion. Small undersurface tear of the body of the medial meniscus.  Lateral meniscus:  Intact.  LIGAMENTS  Cruciates:  Intact ACL and PCL.  Collaterals: Medial collateral ligament is intact. Lateral collateral ligament complex is intact.  CARTILAGE  Patellofemoral: Partial-thickness cartilage loss of the patellar apex with minimal subchondral reactive marrow changes. High-grade partial-thickness cartilage loss with areas of full-thickness cartilage loss of the trochlear groove with subchondral reactive marrow changes.  Medial: High-grade partial-thickness cartilage loss with areas of full-thickness cartilage loss of the medial femoral condyle and medial tibial plateau. Osteochondral lesion involving the weight-bearing surface of the medial femoral condyle measuring 10 x 23 mm with cystic changes and surrounding marrow edema at the OCL-bone interface.  Lateral:  No chondral defect.  Joint: Small joint effusion. Normal Hoffa's fat. No plical thickening.  Popliteal Fossa:  Tiny Baker's cyst.  Intact popliteus tendon.  Extensor Mechanism: Intact quadriceps tendon. Intact patellar tendon. Intact medial patellar retinaculum. Intact lateral patellar retinaculum. Intact MPFL.  Bones:  No acute osseous abnormality.  No aggressive osseous lesion.  Other: No fluid collection or hematoma.  IMPRESSION: 1. Radial tear of the posterior horn of the medial meniscus with peripheral meniscal extrusion. Small  undersurface tear of the body of the medial meniscus. 2. Partial-thickness cartilage loss of the patellar apex with minimal subchondral reactive marrow changes. High-grade partial-thickness cartilage loss with areas of full-thickness cartilage loss of the trochlear groove with subchondral reactive marrow changes. 3. High-grade partial-thickness cartilage loss with areas of full-thickness cartilage loss of the medial femoral condyle and medial tibial plateau. Osteochondral lesion involving the weight-bearing surface of the medial femoral condyle measuring 10 x 23 mm with cystic changes and surrounding marrow edema at the OCL-bone interface.  Electronically Signed   By: Kathreen Devoid   On: 08/09/2018 11:58  Assessment  The primary encounter diagnosis was Chronic pain of both knees (L>R). Diagnoses of Chronic radicular low back pain, Chronic pain syndrome, H/O right knee surgery, Primary osteoarthritis of right knee, Chronic hip pain, left, and Long term current use of opiate analgesic were also pertinent to this visit.  Plan of Care   Ms. Zellie Jenning has a current medication list which includes the following long-term medication(s): amlodipine besylate, fenofibrate, gabapentin, lisinopril, and rosuvastatin.  Pharmacotherapy (Medications Ordered): Meds ordered this encounter  Medications  . gabapentin (NEURONTIN) 300 MG capsule    Sig: Take 1 capsule (300 mg total) by mouth 3 (three) times daily.    Dispense:  270 capsule    Refill:  2  . oxyCODONE-acetaminophen (PERCOCET) 10-325 MG tablet    Sig: 1 tablet every 4-6 hrs as needed for severe pain.    Dispense:  135 tablet    Refill:  0    Chronic Pain. (STOP Act - Not applicable). Fill one day early if closed on scheduled refill date.   Follow-up plan:   Return in about 3 months (around 05/10/2020) for Medication Management.   Recent Visits Date Type Provider Dept  11/13/19 Office Visit Gillis Santa, MD [facility]  Showing recent visits within past 90 days and meeting all other requirements   Today's Visits Date Type Provider Dept  02/08/20 Office Visit Gillis Santa, MD [facility]  Showing today's visits and meeting all other requirements   Future Appointments No visits were found meeting these conditions.  Showing future appointments within next 90 days and meeting all other requirements   I discussed the assessment and treatment plan with the patient. The patient was provided an opportunity to ask questions and all were answered. The patient agreed with the plan and demonstrated an understanding of the instructions.  Patient advised to call back or seek an in-person evaluation if the symptoms or condition worsens.  Duration of encounter: 25 minutes.  Note by: Gillis Santa, MD Date: 02/08/2020; Time: 8:39 AM

## 2020-05-08 ENCOUNTER — Telehealth: Payer: Self-pay | Admitting: *Deleted

## 2020-05-08 NOTE — Telephone Encounter (Signed)
Attempted to call for pre appointment review of allergies/meds. Message left. 

## 2020-05-09 ENCOUNTER — Ambulatory Visit
Payer: Medicaid Other | Attending: Student in an Organized Health Care Education/Training Program | Admitting: Student in an Organized Health Care Education/Training Program

## 2020-05-09 ENCOUNTER — Telehealth: Payer: Self-pay | Admitting: *Deleted

## 2020-05-09 ENCOUNTER — Encounter: Payer: Self-pay | Admitting: Student in an Organized Health Care Education/Training Program

## 2020-05-09 ENCOUNTER — Other Ambulatory Visit: Payer: Self-pay

## 2020-05-09 DIAGNOSIS — M5416 Radiculopathy, lumbar region: Secondary | ICD-10-CM | POA: Diagnosis not present

## 2020-05-09 DIAGNOSIS — M1711 Unilateral primary osteoarthritis, right knee: Secondary | ICD-10-CM

## 2020-05-09 DIAGNOSIS — Z9889 Other specified postprocedural states: Secondary | ICD-10-CM | POA: Diagnosis not present

## 2020-05-09 DIAGNOSIS — M7918 Myalgia, other site: Secondary | ICD-10-CM

## 2020-05-09 DIAGNOSIS — Z79891 Long term (current) use of opiate analgesic: Secondary | ICD-10-CM

## 2020-05-09 DIAGNOSIS — G894 Chronic pain syndrome: Secondary | ICD-10-CM

## 2020-05-09 DIAGNOSIS — G8929 Other chronic pain: Secondary | ICD-10-CM

## 2020-05-09 DIAGNOSIS — M25562 Pain in left knee: Secondary | ICD-10-CM

## 2020-05-09 DIAGNOSIS — M25561 Pain in right knee: Secondary | ICD-10-CM

## 2020-05-09 MED ORDER — GABAPENTIN 300 MG PO CAPS: 300.0000 mg | ORAL_CAPSULE | Freq: Three times a day (TID) | ORAL | 2 refills | Status: DC

## 2020-05-09 MED ORDER — OXYCODONE-ACETAMINOPHEN 10-325 MG PO TABS: ORAL_TABLET | ORAL | 0 refills | Status: AC

## 2020-05-09 NOTE — Progress Notes (Signed)
Patient: Terri Jennings  Service Category: E/M  Provider: Gillis Santa, MD  DOB: October 22, 1956  DOS: 05/09/2020  Location: Office  MRN: [id number]  Setting: Ambulatory outpatient  Referring Provider: [facility] [place]*  Type: Established Patient  Specialty: Interventional Pain Management  PCP: [facility] [place]  Location: Home  Delivery: TeleHealth     Virtual Encounter - Pain Management PROVIDER NOTE: Information contained herein reflects review and annotations entered in association with encounter. Interpretation of such information and data should be left to medically-trained personnel. Information provided to patient can be located elsewhere in the medical record under "Patient Instructions". Document created using STT-dictation technology, any transcriptional errors that may result from process are unintentional.    Contact & Pharmacy Preferred: [phone] Home: [phone] (home) Mobile: [phone] (mobile) E-mail: No e-mail address on record  [id number] [address] [place]Phone: [phone]: [address]), [place]- [address] [address]Phone: [phone]: [phone]  Pre-screening  Ms. Devora offered "in-person" vs "virtual" encounter. She indicated preferring virtual for this encounter.   Reason COVID-19*   Social distancing based on CDC and AMA recommendations.   I contacted ERonald PippinsCardona on 05/09/2020 via televisit.      I clearly identified myself as BGillis Santa MD. I verified that I was speaking with the correct person using two identifiers (Name: EKierra Jezewski and date of birth: [birth year].  Consent I sought verbal advanced consent from EJunie Bamefor virtual visit interactions. I informed Ms. Ionescu of possible security and  privacy concerns, risks, and limitations associated with providing "not-in-person" medical evaluation and management services. I also informed Ms. Heier of the availability of "in-person" appointments. Finally, I informed her that there would be a charge for the virtual visit and that she could be  personally, fully or partially, financially responsible for it. Ms. CSportsmanexpressed understanding and agreed to proceed.   Historic Elements   Ms. EMindee Robledois a 64y.o. year old, female patient evaluated today after her last contact with our practice on 05/08/2020. Ms. CHarewood has a past medical history of Arthritis, Dysrhythmia, Enlarged heart, and Hypertension. She also  has a past surgical history that includes Knee surgery (Right, 2009) and Cataract extraction w/PHACO (Right, 03/18/2017). Ms. CGraybealhas a current medication list which includes the following prescription(s): amlodipine besylate, atropine, fenofibrate, gabapentin, lisinopril, oxycodone-acetaminophen, prednisolone acetate, and rosuvastatin. She  reports that she has never smoked. She has never used smokeless tobacco. She reports that she does not drink alcohol and does not use drugs. Ms. CLentzis allergic to morphine and related, oxycontin [oxycodone hcl], tramadol, and hydrocodone.   HPI  Today, she is being contacted for medication management.   Patient unable to come in for her appointment today as she is feeling ill.  She states that she has a sore throat and feels feverish.  She states that she is going to follow-up with her primary care provider.  Medication refill today as below.  No change in medical history other than illness as noted acutely.  Refill of gabapentin and oxycodone as below.  Follow-up in 3 months face-to-face.  Pharmacotherapy Assessment  Analgesic:  02/09/2020  1   02/08/2020  Oxycodone-Acetaminophen 10-325  135.00  23 Bi Lat   [id number]  Nor (4575)   0  88.04 MME  Medicaid   [place]     Monitoring: [place] PMP: PDMP reviewed during this encounter.       Pharmacotherapy: No side-effects or adverse reactions reported. Compliance: No problems identified. Effectiveness: Clinically acceptable. Plan: Refer to "POC".  UDS:  Summary  Date Value Ref Range Status  11/16/2019 Note  Final    Comment:    ==================================================================== ToxASSURE Select 13 (MW) ==================================================================== Test                             Result       Flag       Units Drug Absent but Declared for Prescription Verification   Oxycodone                      Not Detected UNEXPECTED ng/mg creat ==================================================================== Test                      Result    Flag   Units      Ref Range   Creatinine              129              mg/dL      >=20 ==================================================================== Declared Medications:  The flagging and interpretation on this report are based on the  following declared medications.  Unexpected results may arise from  inaccuracies in the declared medications.  **Note: The testing scope of this panel includes these medications:  Oxycodone  **Note: The testing scope of this panel does not include the  following reported medications:  Acetaminophen  Amlodipine  Atropine  Fenofibrate  Gabapentin  Lisinopril  Prednisolone  Rosuvastatin ==================================================================== For clinical consultation, please call [phone]. ====================================================================     Laboratory Chemistry Profile   Renal Lab Results  Component Value Date   LABCREA 148.41 01/22/2015     Hepatic No results found for: AST, ALT, ALBUMIN, ALKPHOS, HCVAB, AMYLASE, LIPASE, AMMONIA   Electrolytes No results found for: NA, K, CL, CALCIUM, MG, PHOS   Bone No results found for: VD25OH,  [id number], [id number], OM3559RC1, 25OHVITD1, 25OHVITD2, 25OHVITD3, TESTOFREE, TESTOSTERONE   Inflammation (CRP: Acute Phase) (ESR: Chronic Phase) No results found for: CRP, ESRSEDRATE, LATICACIDVEN     Note: Above Lab results reviewed.   Imaging  MR KNEE LEFT WO CONTRAST CLINICAL DATA:  Left knee pain since May 2019.  EXAM: MRI OF THE LEFT KNEE WITHOUT CONTRAST  TECHNIQUE: Multiplanar, multisequence MR imaging of the knee was performed. No intravenous contrast was administered.  COMPARISON:  None.  FINDINGS: MENISCI  Medial meniscus: Radial tear of the posterior horn of the medial meniscus with peripheral meniscal extrusion. Small undersurface tear of the body of the medial meniscus.  Lateral meniscus:  Intact.  LIGAMENTS  Cruciates:  Intact ACL and PCL.  Collaterals: Medial collateral ligament is intact. Lateral collateral ligament complex is intact.  CARTILAGE  Patellofemoral: Partial-thickness cartilage loss of the patellar apex with minimal subchondral reactive marrow changes. High-grade partial-thickness cartilage loss with areas of full-thickness cartilage loss of the trochlear groove with subchondral reactive marrow changes.  Medial: High-grade partial-thickness cartilage loss with areas of full-thickness cartilage loss of the medial femoral condyle and medial tibial plateau. Osteochondral lesion involving the weight-bearing surface of the medial femoral condyle measuring 10 x 23 mm with cystic changes and surrounding marrow edema at the OCL-bone interface.  Lateral:  No chondral defect.  Joint: Small joint effusion.  Normal Hoffa's fat. No plical thickening.  Popliteal Fossa:  Tiny Baker's cyst.  Intact popliteus tendon.  Extensor Mechanism: Intact quadriceps tendon. Intact patellar tendon. Intact medial patellar retinaculum. Intact lateral patellar retinaculum. Intact MPFL.  Bones:  No acute osseous abnormality.  No aggressive osseous  lesion.  Other: No fluid collection or hematoma.  IMPRESSION: 1. Radial tear of the posterior horn of the medial meniscus with peripheral meniscal extrusion. Small undersurface tear of the body of the medial meniscus. 2. Partial-thickness cartilage loss of the patellar apex with minimal subchondral reactive marrow changes. High-grade partial-thickness cartilage loss with areas of full-thickness cartilage loss of the trochlear groove with subchondral reactive marrow changes. 3. High-grade partial-thickness cartilage loss with areas of full-thickness cartilage loss of the medial femoral condyle and medial tibial plateau. Osteochondral lesion involving the weight-bearing surface of the medial femoral condyle measuring 10 x 23 mm with cystic changes and surrounding marrow edema at the OCL-bone interface.  Electronically Signed   By: Kathreen Devoid   On: 08/09/2018 11:58  Assessment  The primary encounter diagnosis was H/O right knee surgery. Diagnoses of Chronic radicular low back pain, Chronic pain syndrome, Primary osteoarthritis of right knee, Long term current use of opiate analgesic, Chronic pain of left knee, Chronic musculoskeletal pain, Morbid obesity ([place]), and Chronic pain of both knees (L>R) were also pertinent to this visit.  Plan of Care  Ms. Raeanna Soberanes has a current medication list which includes the following long-term medication(s): amlodipine besylate, fenofibrate, gabapentin, lisinopril, and rosuvastatin.  Pharmacotherapy (Medications Ordered): Meds ordered this encounter  Medications   oxyCODONE-acetaminophen (PERCOCET) 10-325 MG tablet    Sig: 1 tablet every 4-6 hrs as needed for severe pain.    Dispense:  135 tablet    Refill:  0    Chronic Pain. (STOP Act - Not applicable). Fill one day early if closed on scheduled refill date.   gabapentin (NEURONTIN) 300 MG capsule    Sig: Take 1 capsule (300 mg total) by mouth 3 (three) times daily.     Dispense:  270 capsule    Refill:  2   Follow-up plan:   Return in about 3 months (around 08/09/2020) for Medication Management, in person, (UDS).   Recent Visits No visits were found meeting these conditions. Showing recent visits within past 90 days and meeting all other requirements Today's Visits Date Type Provider Dept  05/09/20 Telemedicine Gillis Santa, MD [facility]  Showing today's visits and meeting all other requirements Future Appointments No visits were found meeting these conditions. Showing future appointments within next 90 days and meeting all other requirements  I discussed the assessment and treatment plan with the patient. The patient was provided an opportunity to ask questions and all were answered. The patient agreed with the plan and demonstrated an understanding of the instructions.   Patient advised to call back or seek an in-person evaluation if the symptoms or condition worsens.  Duration of encounter: 28mnutes.  Note by: BGillis Santa MD Date: 05/09/2020; Time: 9:31 AM

## 2020-07-23 ENCOUNTER — Telehealth: Payer: Self-pay | Admitting: Student in an Organized Health Care Education/Training Program

## 2020-07-23 NOTE — Telephone Encounter (Signed)
error 

## 2020-07-29 ENCOUNTER — Other Ambulatory Visit: Payer: Self-pay | Admitting: Student in an Organized Health Care Education/Training Program

## 2020-07-29 DIAGNOSIS — G894 Chronic pain syndrome: Secondary | ICD-10-CM

## 2020-07-31 LAB — TOXASSURE SELECT 13 (MW), URINE

## 2020-08-06 ENCOUNTER — Encounter: Payer: Self-pay | Admitting: Student in an Organized Health Care Education/Training Program

## 2020-08-06 ENCOUNTER — Other Ambulatory Visit: Payer: Self-pay

## 2020-08-06 ENCOUNTER — Ambulatory Visit
Payer: Medicaid Other | Attending: Student in an Organized Health Care Education/Training Program | Admitting: Student in an Organized Health Care Education/Training Program

## 2020-08-06 VITALS — BP 102/84 | HR 87 | Temp 97.1°F | Resp 20 | Ht 69.0 in | Wt 260.0 lb

## 2020-08-06 DIAGNOSIS — M5416 Radiculopathy, lumbar region: Secondary | ICD-10-CM | POA: Diagnosis not present

## 2020-08-06 DIAGNOSIS — Z91148 Patient's other noncompliance with medication regimen for other reason: Secondary | ICD-10-CM | POA: Insufficient documentation

## 2020-08-06 DIAGNOSIS — G894 Chronic pain syndrome: Secondary | ICD-10-CM

## 2020-08-06 DIAGNOSIS — Z9889 Other specified postprocedural states: Secondary | ICD-10-CM | POA: Diagnosis not present

## 2020-08-06 DIAGNOSIS — R8290 Unspecified abnormal findings in urine: Secondary | ICD-10-CM

## 2020-08-06 DIAGNOSIS — G8929 Other chronic pain: Secondary | ICD-10-CM

## 2020-08-06 DIAGNOSIS — Z9114 Patient's other noncompliance with medication regimen: Secondary | ICD-10-CM

## 2020-08-06 MED ORDER — GABAPENTIN 300 MG PO CAPS: 300.0000 mg | ORAL_CAPSULE | Freq: Three times a day (TID) | ORAL | 2 refills | Status: AC

## 2020-08-06 NOTE — Progress Notes (Signed)
PROVIDER NOTE: Information contained herein reflects review and annotations entered in association with encounter. Interpretation of such information and data should be left to medically-trained personnel. Information provided to patient can be located elsewhere in the medical record under "Patient Instructions". Document created using STT-dictation technology, any transcriptional errors that may result from process are unintentional.    Patient: Terri Jennings  Service Category: E/M  Provider: Gillis Santa, MD  DOB: 1956-07-12  DOS: 08/06/2020  Specialty: Interventional Pain Management  MRN: [id number]  Setting: Ambulatory outpatient  PCP: [facility] [place]  Type: Established Patient    Referring Provider: [facility] [place]  Location: Office  Delivery: Face-to-face     HPI  Reason for encounter: Terri Jennings, a 64 y.o. year old female, is here today for evaluation and management of her Chronic pain syndrome [G89.4]. Terri Jennings primary complain today is Knee Pain (bilateral) and Foot Pain (bilateral) Last encounter: Practice (07/23/2020). My last encounter with her was on 07/23/2020. Pertinent problems: Terri Jennings has Chronic radicular low back pain; Chronic knee pain; Primary osteoarthritis of right knee; Morbid obesity ([place]); Chronic pain syndrome; Chronic pain of both knees (L>R); Chronic musculoskeletal pain; and Chronic hip pain, left on their pertinent problem list. Pain Assessment: Severity of Chronic pain is reported as a 9 /10. Location: Knee Right, Left/denies. Onset: More than a month ago. Quality: Numbness, Throbbing. Timing: Constant. Modifying factor(s): denies. Vitals:  height is _0  (1.753 m) and weight is 260 lb (117.9 kg). Her temperature is 97.1 F (36.2 C) (abnormal). Her blood pressure is 102/84 and her pulse is 87. Her respiration is 20 and oxygen saturation is 100%.   Patient presents today for medication management.  She  completed her urine toxicology screen last week which was inappropriately positive for buprenorphine which I do not prescribe her nor she prescribed this medication (checked PMP).  Furthermore her last 2 urine screens have been negative for oxycodone which is a prescribed medication.  Patient did not have explanation for this.  She denied intake of buprenorphine.  I informed her that this is a violation of our pain contract and that we will need to discontinue any opioid analgesics.  I will refill her gabapentin as below and she can still return here for any interventional treatment plans that we have discussed in the past.  Pharmacotherapy Assessment    Monitoring: [place] PMP: PDMP reviewed during this encounter.       Pharmacotherapy: No side-effects or adverse reactions reported. Compliance: No problems identified. Effectiveness: Clinically acceptable.  [name], RN  08/06/2020 10:39 AM  Signed Nursing Pain Medication Assessment:  Safety precautions to be maintained throughout the outpatient stay will include: orient to surroundings, keep bed in low position, maintain call bell within reach at all times, provide assistance with transfer out of bed and ambulation.  Medication Inspection Compliance: Terri Jennings did not comply with our request to bring her pills to be counted. She was reminded that bringing the medication bottles, even when empty, is a requirement.  Medication: None brought in. Pill/Patch Count: None available to be counted. Bottle Appearance: No container available. Did not bring bottle(s) to appointment. Filled Date: N/A Last Medication intake:  Today    UDS:  Summary  Date Value Ref Range Status  07/29/2020 Note  Final    Comment:    ==================================================================== [employer] [facility] (MW) ==================================================================== Test  Result       Flag       Units  Drug  Present not Declared for Prescription Verification   Buprenorphine                  7            UNEXPECTED ng/mg creat    Sources of buprenorphine include scheduled prescription medications.  Drug Absent but Declared for Prescription Verification   Oxycodone                      Not Detected UNEXPECTED ng/mg creat ==================================================================== Test                      Result    Flag   Units      Ref Range   Creatinine              72               mg/dL      >=20 ==================================================================== Declared Medications:  The flagging and interpretation on this report are based on the  following declared medications.  Unexpected results may arise from  inaccuracies in the declared medications.   **Note: The testing scope of this panel includes these medications:   Oxycodone (Percocet)   **Note: The testing scope of this panel does not include the  following reported medications:   Acetaminophen (Percocet)  Amlodipine  Eye Drop  Fenofibrate  Gabapentin (Neurontin)  Lisinopril (Zestril)  Rosuvastatin (Crestor) ==================================================================== For clinical consultation, please call [phone]. ====================================================================      ROS  Constitutional: Denies any fever or chills Gastrointestinal: No reported hemesis, hematochezia, vomiting, or acute GI distress Musculoskeletal: Bilateral knee pain Neurological: No reported episodes of acute onset apraxia, aphasia, dysarthria, agnosia, amnesia, paralysis, loss of coordination, or loss of consciousness  Medication Review  amLODIPine Besylate, atropine, fenofibrate, gabapentin, lisinopril, oxyCODONE-acetaminophen, and prednisoLONE acetate  History Review  Allergy: Terri Jennings is allergic to morphine and related, oxycontin [oxycodone hcl], tramadol, and hydrocodone. Drug: [name]  reports no history of drug use. Alcohol:  reports no history of alcohol use. Tobacco:  reports that she has never smoked. She has never used smokeless tobacco. Social: Terri Jennings  reports that she has never smoked. She has never used smokeless tobacco. She reports that she does not drink alcohol and does not use drugs. Medical:  has a past medical history of Arthritis, Dysrhythmia, Enlarged heart, and Hypertension. Surgical: Terri Jennings  has a past surgical history that includes Knee surgery (Right, 2009) and Cataract extraction w/PHACO (Right, 03/18/2017). Family: family history includes Cancer in her maternal grandmother; Stroke in her father.  Laboratory Chemistry Profile   Renal Lab Results  Component Value Date   LABCREA 148.41 01/22/2015     Hepatic No results found for: AST, ALT, ALBUMIN, ALKPHOS, HCVAB, AMYLASE, LIPASE, AMMONIA   Electrolytes No results found for: NA, K, CL, CALCIUM, MG, PHOS   Bone No results found for: VD25OH, KZ601UX3ATF, TD3220UR4, YH0623JS2, 25OHVITD1, 25OHVITD2, 25OHVITD3, TESTOFREE, TESTOSTERONE   Inflammation (CRP: Acute Phase) (ESR: Chronic Phase) No results found for: CRP, ESRSEDRATE, LATICACIDVEN     Note: Above Lab results reviewed.  Recent Imaging Review  MR KNEE LEFT WO CONTRAST CLINICAL DATA:  Left knee pain since May 2019.  EXAM: MRI OF THE LEFT KNEE WITHOUT CONTRAST  TECHNIQUE: Multiplanar, multisequence MR imaging of the knee was performed. No intravenous contrast was administered.  COMPARISON:  None.  FINDINGS: MENISCI  Medial meniscus: Radial tear of the posterior horn of the medial meniscus with peripheral meniscal extrusion. Small undersurface tear of the body of the medial meniscus.  Lateral meniscus:  Intact.  LIGAMENTS  Cruciates:  Intact ACL and PCL.  Collaterals: Medial collateral ligament is intact. Lateral collateral ligament complex is intact.  CARTILAGE  Patellofemoral: Partial-thickness  cartilage loss of the patellar apex with minimal subchondral reactive marrow changes. High-grade partial-thickness cartilage loss with areas of full-thickness cartilage loss of the trochlear groove with subchondral reactive marrow changes.  Medial: High-grade partial-thickness cartilage loss with areas of full-thickness cartilage loss of the medial femoral condyle and medial tibial plateau. Osteochondral lesion involving the weight-bearing surface of the medial femoral condyle measuring 10 x 23 mm with cystic changes and surrounding marrow edema at the OCL-bone interface.  Lateral:  No chondral defect.  Joint: Small joint effusion. Normal Hoffa's fat. No plical thickening.  Popliteal Fossa:  Tiny Baker's cyst.  Intact popliteus tendon.  Extensor Mechanism: Intact quadriceps tendon. Intact patellar tendon. Intact medial patellar retinaculum. Intact lateral patellar retinaculum. Intact MPFL.  Bones:  No acute osseous abnormality.  No aggressive osseous lesion.  Other: No fluid collection or hematoma.  IMPRESSION: 1. Radial tear of the posterior horn of the medial meniscus with peripheral meniscal extrusion. Small undersurface tear of the body of the medial meniscus. 2. Partial-thickness cartilage loss of the patellar apex with minimal subchondral reactive marrow changes. High-grade partial-thickness cartilage loss with areas of full-thickness cartilage loss of the trochlear groove with subchondral reactive marrow changes. 3. High-grade partial-thickness cartilage loss with areas of full-thickness cartilage loss of the medial femoral condyle and medial tibial plateau. Osteochondral lesion involving the weight-bearing surface of the medial femoral condyle measuring 10 x 23 mm with cystic changes and surrounding marrow edema at the OCL-bone interface.  Electronically Signed   By: Kathreen Devoid   On: 08/09/2018 11:58 Note: Reviewed        Physical Exam  General appearance:  Well nourished, well developed, and well hydrated. In no apparent acute distress Mental status: Alert, oriented x 3 (person, place, & time)       Respiratory: No evidence of acute respiratory distress Eyes: PERLA Vitals: BP 102/84   Pulse 87   Temp (!) 97.1 F (36.2 C)   Resp 20   Ht _0  (1.753 m)   Wt 260 lb (117.9 kg)   SpO2 100%   BMI 38.40 kg/m  BMI: Estimated body mass index is 38.4 kg/m as calculated from the following:   Height as of this encounter: _1  (1.753 m).   Weight as of this encounter: 260 lb (117.9 kg). Ideal: Ideal body weight: 66.2 kg (145 lb 15.1 oz) Adjusted ideal body weight: 86.9 kg (191 lb 9.1 oz)  Lumbar Spine Area Exam  Skin & Axial Inspection: No masses, redness, or swelling Alignment: Symmetrical Functional ROM: Pain restricted ROM       Stability: No instability detected Muscle Tone/Strength: Functionally intact. No obvious neuro-muscular anomalies detected. Sensory (Neurological): Dermatomal pain pattern  Lower Extremity Exam    Side:Right lower extremity  Side:Left lower extremity  Stability:No instability observed  Stability:No instability observed  Skin & Extremity Inspection:Edema  Skin & Extremity Inspection:Edema  Functional BUL:AGTX restricted ROM   Functional MIW:OEHO restricted ROM   Muscle Tone/Strength:Functionally intact. No obvious neuro-muscular anomalies detected.  Muscle Tone/Strength:Functionally intact. No obvious neuro-muscular anomalies detected.  Sensory (Neurological):Arthropathic arthralgia  Sensory (Neurological):Arthropathic arthralgia  Palpation:Complains of area  being tender to palpation  Palpation:Complains of area being tender to palpation      Assessment   Status Diagnosis  Persistent Persistent Persistent 1. Chronic pain syndrome   2. Chronic radicular low back pain   3. H/O right knee surgery   4. Pain management contract  terminated   5. Abnormal result on screening urine test      Updated Problems: Problem  Chronic Hip Pain, Left  Chronic pain of both knees (L>R)  Chronic Musculoskeletal Pain  Primary Osteoarthritis of Right Knee  Morbid Obesity ([facility])  Chronic Pain Syndrome  Chronic Radicular Low Back Pain  Chronic Knee Pain  Pain Management Contract Terminated   Two previous urine toxicology screens were negative for oxycodone which she has prescribed and her most recent urine toxicology screen was inappropriately positive for buprenorphine.  Violation of pain contract.  Encourage patient to not take unprescribed controlled substances.  I will refill her gabapentin and she can return for any interventional treatments that we have discussed in the past.  I will no longer be prescribing her oxycodone.   Abnormal Result On Screening Urine Test    Plan of Care  Terri Jennings has a current medication list which includes the following long-term medication(s): amlodipine besylate, fenofibrate, gabapentin, and lisinopril.  1.  Violation of pain contract given inappropriate urine toxicology screen on 2 occasions.  Her last one was inappropriately positive for buprenorphine and her previous 2 have been negative for her prescribed oxycodone.  Discontinue oxycodone.  Focus on nonopioid analgesics and interventional pain therapies 2.  Refill gabapentin as below, patient should be good for the next 12 months 3.  Return for any interventional therapies including genicular nerve block which the patient has had therapeutic relief in the past.  Pharmacotherapy (Medications Ordered): Meds ordered this encounter  Medications  . gabapentin (NEURONTIN) 300 MG capsule    Sig: Take 1 capsule (300 mg total) by mouth 3 (three) times daily.    Dispense:  270 capsule    Refill:  2   Follow-up plan:   Return if symptoms worsen or fail to improve.   Recent Visits Date Type Provider Dept  05/09/20  Telemedicine Gillis Santa, MD [facility]  Showing recent visits within past 90 days and meeting all other requirements Today's Visits Date Type Provider Dept  08/06/20 Office Visit Gillis Santa, MD [facility]  Showing today's visits and meeting all other requirements Future Appointments No visits were found meeting these conditions. Showing future appointments within next 90 days and meeting all other requirements  I discussed the assessment and treatment plan with the patient. The patient was provided an opportunity to ask questions and all were answered. The patient agreed with the plan and demonstrated an understanding of the instructions.  Patient advised to call back or seek an in-person evaluation if the symptoms or condition worsens.  Duration of encounter: 64mnutes.  Note by: BGillis Santa MD Date: 08/06/2020; Time: 10:54 AM

## 2020-08-06 NOTE — Progress Notes (Signed)
Nursing Pain Medication Assessment:  Safety precautions to be maintained throughout the outpatient stay will include: orient to surroundings, keep bed in low position, maintain call bell within reach at all times, provide assistance with transfer out of bed and ambulation.  Medication Inspection Compliance: Terri Jennings did not comply with our request to bring her pills to be counted. She was reminded that bringing the medication bottles, even when empty, is a requirement.  Medication: None brought in. Pill/Patch Count: None available to be counted. Bottle Appearance: No container available. Did not bring bottle(s) to appointment. Filled Date: N/A Last Medication intake:  Today

## 2021-11-19 ENCOUNTER — Other Ambulatory Visit: Payer: Self-pay | Admitting: Family Medicine

## 2021-11-19 DIAGNOSIS — Z1231 Encounter for screening mammogram for malignant neoplasm of breast: Secondary | ICD-10-CM

## 2023-11-25 ENCOUNTER — Emergency Department
Admission: EM | Admit: 2023-11-25 | Discharge: 2023-11-25 | Disposition: A | Discharge status: Home / Self Care | Payer: Medicare Other | Attending: Emergency Medicine | Admitting: Emergency Medicine

## 2023-11-25 ENCOUNTER — Emergency Department: Payer: Medicare Other

## 2023-11-25 ENCOUNTER — Encounter: Payer: Self-pay | Admitting: Emergency Medicine

## 2023-11-25 ENCOUNTER — Other Ambulatory Visit: Payer: Self-pay

## 2023-11-25 DIAGNOSIS — S93601A Unspecified sprain of right foot, initial encounter: Secondary | ICD-10-CM | POA: Diagnosis not present

## 2023-11-25 DIAGNOSIS — X501XXA Overexertion from prolonged static or awkward postures, initial encounter: Secondary | ICD-10-CM | POA: Diagnosis not present

## 2023-11-25 DIAGNOSIS — M79671 Pain in right foot: Secondary | ICD-10-CM | POA: Diagnosis present

## 2023-11-25 MED ORDER — GABAPENTIN 100 MG PO CAPS: 100.0000 mg | ORAL_CAPSULE | Freq: Three times a day (TID) | ORAL | 0 refills | Status: AC

## 2023-11-25 NOTE — Discharge Instructions (Addendum)
Your x-ray does not show any broken bones.  Please rest, ice, elevate your foot.  You may use the Ace wrap for extra support.  You may follow-up with podiatry if your symptoms persist.  Please return for any new, worsening, or change in symptoms or other concerns.  It was a pleasure caring for you today.

## 2023-11-25 NOTE — ED Triage Notes (Signed)
Pt here with right foot pain. Pt states she fell about a week ago and the pain is not getting any better. Pt states she can still put pressure on that foot but it is painful. Pt stable in triage.

## 2023-11-25 NOTE — ED Notes (Signed)
See triage note  Presents with cont'd pain to right foot  States had a fall about 1 week ago  Cont's to have pain  Pain increases with pressure  Good pulses

## 2023-11-25 NOTE — ED Provider Notes (Signed)
[facility] Provider Note    Event Date/Time   First MD Initiated Contact with Patient 11/25/23 [phone]     (approximate)   History   Foot Pain   HPI  Terri Jennings is a 67 y.o. female with a past medical history of chronic pain syndrome who presents today for evaluation of foot pain.  Patient reports that she twisted her foot and ankle on 12/12, and has had pain and swelling ever since.  She denies numbness and tingling.  She reports that the pain is primarily in the top of her foot.  She is still able to ambulate.  She has been using an Aircast with limited improvement of her symptoms.  She denies any other injuries from the fall or more recently.  Patient Active Problem List   Diagnosis Date Noted   Pain management contract terminated 08/06/2020   Abnormal result on screening urine test 08/06/2020   Chronic hip pain, left 01/30/2019   Chronic pain of both knees (L>R) 08/25/2018   Chronic musculoskeletal pain 08/25/2018   Primary osteoarthritis of right knee 02/21/2018   Morbid obesity ([facility]) 02/21/2018   Chronic pain syndrome 02/21/2018   Chronic radicular low back pain 01/22/2015   Chronic knee pain 01/22/2015          Physical Exam   Triage Vital Signs: ED Triage Vitals  Encounter Vitals Group     BP 11/25/23 0730 111/63     Systolic BP Percentile --      Diastolic BP Percentile --      Pulse Rate 11/25/23 0730 88     Resp 11/25/23 0730 17     Temp 11/25/23 0730 97.8 F (36.6 C)     Temp Source 11/25/23 0730 Oral     SpO2 11/25/23 0730 96 %     Weight 11/25/23 0729 259 lb 14.8 oz (117.9 kg)     Height 11/25/23 0729 5\' 9"  (1.753 m)     Head Circumference --      Peak Flow --      Pain Score 11/25/23 0729 8     Pain Loc --      Pain Education --      Exclude from Growth Chart --     Most recent vital signs: Vitals:   11/25/23 0730  BP: 111/63  Pulse: 88  Resp: 17  Temp: 97.8 F (36.6 C)  SpO2: 96%    Physical  Exam Vitals and nursing note reviewed.  Constitutional:      General: Awake and alert. No acute distress.    Appearance: Normal appearance. The patient is obese.  HENT:     Head: Normocephalic and atraumatic.     Mouth: Mucous membranes are moist.  Eyes:     General: PERRL. Normal EOMs        Right eye: No discharge.        Left eye: No discharge.     Conjunctiva/sclera: Conjunctivae normal.  Cardiovascular:     Rate and Rhythm: Normal rate and regular rhythm.     Pulses: Normal pulses.  Pulmonary:     Effort: Pulmonary effort is normal. No respiratory distress.     Breath sounds: Normal breath sounds.  Abdominal:     Abdomen is soft. There is no abdominal tenderness. No rebound or guarding. No distention. Musculoskeletal:        General: No swelling. Normal range of motion.     Cervical back: Normal range of motion and  neck supple.  Right foot: Relatively normal-appearing foot with mild swelling over the dorsum of the foot.  No lateral or medial malleolar tenderness or proximal fifth metacarpal tenderness. No proximal fibular tenderness. 2+ pedal pulses with brisk capillary refill. Intact distal sensation and strength with normal ROM. Able to plantar flex and dorsiflex against resistance. Able to invert and evert against resistance. Negative  dorsiflexion external rotation test. Negative squeeze test. Negative Thompson test Skin:    General: Skin is warm and dry.     Capillary Refill: Capillary refill takes less than 2 seconds.     Findings: No rash.  Neurological:     Mental Status: The patient is awake and alert.      ED Results / Procedures / Treatments   Labs (all labs ordered are listed, but only abnormal results are displayed) Labs Reviewed - No data to display   EKG     RADIOLOGY I independently reviewed and interpreted imaging and agree with radiologists findings.     PROCEDURES:  Critical Care performed:   Procedures   MEDICATIONS ORDERED IN  ED: Medications - No data to display   IMPRESSION / MDM / ASSESSMENT AND PLAN / ED COURSE  I reviewed the triage vital signs and the nursing notes.   Differential diagnosis includes, but is not limited to, sprain, fracture, contusion.  I reviewed the patient's chart.  Patient was followed by Dr. Cherylann Ratel in the pain management clinic until 2021 at which point she had a violation of her pain contract and her oxycodone was discontinued.  Patient is awake and alert, hemodynamically stable and neurovascularly intact.  There is no deformity and she has full and normal range of motion.  She has 2+ pedal pulses, foot is warm and well-perfused, I do not suspect vascular etiology.  No swelling or pitting edema or calf pain in her lower leg, not consistent with DVT.  No warmth or erythema or wounds to suggest infectious etiology.  No fevers or chills.  No constitutional symptoms.  X-ray obtained demonstrates soft tissue fullness without fracture or dislocation, mild midfoot arthrosis and calcaneal enthesopathy, though no acute findings.  Patient was given an Ace wrap for extra support.  She was also given the information for podiatry if her pain persists.  She was instructed to rest, ice, elevate her foot.  We discussed return precautions and outpatient follow-up.  Patient understands and agrees with plan.  She requested some gabapentin which she has run out of to be sent to her pharmacy, 3 days worth were sent to her pharmacy.  She was reminded that she cannot drive, operate heavy machinery, or perform any test that require concentration while taking this medication.  She understands and agrees.  She was discharged in stable condition.  Patient's presentation is most consistent with acute complicated illness / injury requiring diagnostic workup.   FINAL CLINICAL IMPRESSION(S) / ED DIAGNOSES   Final diagnoses:  Sprain of right foot, initial encounter     Rx / DC Orders   ED Discharge Orders           Ordered    gabapentin (NEURONTIN) 100 MG capsule  3 times daily        11/25/23 0806             Note:  This document was prepared using Dragon voice recognition software and may include unintentional dictation errors.   Jackelyn Hoehn, PA-C 11/25/23 5409    Janith Lima, MD 11/25/23 1539
